# Patient Record
Sex: Female | Born: 1965 | Race: White | Hispanic: No | Marital: Single | Smoking: Never smoker
Health system: Southern US, Community
[De-identification: ages and names within clinical notes are randomized; demographics above are authoritative.]

## PROBLEM LIST (undated history)

## (undated) DIAGNOSIS — E039 Hypothyroidism, unspecified: Secondary | ICD-10-CM

## (undated) DIAGNOSIS — F419 Anxiety disorder, unspecified: Secondary | ICD-10-CM

## (undated) DIAGNOSIS — I10 Essential (primary) hypertension: Secondary | ICD-10-CM

## (undated) DIAGNOSIS — C569 Malignant neoplasm of unspecified ovary: Secondary | ICD-10-CM

## (undated) HISTORY — PX: ABDOMINAL HYSTERECTOMY: SHX81

---

## 1999-11-30 DIAGNOSIS — Z9889 Other specified postprocedural states: Secondary | ICD-10-CM

## 2010-03-21 ENCOUNTER — Inpatient Hospital Stay (HOSPITAL_COMMUNITY): Admission: EM | Admit: 2010-03-21 | Discharge: 2010-03-26 | Payer: Self-pay | Admitting: Emergency Medicine

## 2010-03-21 DIAGNOSIS — Z9089 Acquired absence of other organs: Secondary | ICD-10-CM

## 2010-03-22 ENCOUNTER — Ambulatory Visit: Payer: Self-pay | Admitting: Internal Medicine

## 2010-03-23 ENCOUNTER — Encounter (INDEPENDENT_AMBULATORY_CARE_PROVIDER_SITE_OTHER): Payer: Self-pay

## 2010-03-24 LAB — CONVERTED CEMR LAB
ALT: 246 units/L
Albumin: 3.3 g/dL
Alkaline Phosphatase: 144 units/L
Basophils Relative: 1 %
CO2: 28 meq/L
Chloride: 101 meq/L
HCT: 42.5 %
Lymphocytes Relative: 18 %
Lymphs Abs: 1.6 10*3/uL
MCV: 84.6 fL
Monocytes Relative: 6 %
Neutro Abs: 6.6 10*3/uL
Platelets: 299 10*3/uL
Potassium: 3.6 meq/L
Sodium: 137 meq/L
WBC: 9 10*3/uL

## 2010-04-09 ENCOUNTER — Ambulatory Visit: Payer: Self-pay | Admitting: Internal Medicine

## 2010-04-09 ENCOUNTER — Encounter (INDEPENDENT_AMBULATORY_CARE_PROVIDER_SITE_OTHER): Payer: Self-pay | Admitting: Nurse Practitioner

## 2010-04-09 DIAGNOSIS — I10 Essential (primary) hypertension: Secondary | ICD-10-CM

## 2010-04-09 DIAGNOSIS — E669 Obesity, unspecified: Secondary | ICD-10-CM | POA: Insufficient documentation

## 2010-04-09 DIAGNOSIS — F411 Generalized anxiety disorder: Secondary | ICD-10-CM | POA: Insufficient documentation

## 2010-04-14 ENCOUNTER — Encounter (INDEPENDENT_AMBULATORY_CARE_PROVIDER_SITE_OTHER): Payer: Self-pay | Admitting: Nurse Practitioner

## 2010-04-15 DIAGNOSIS — E039 Hypothyroidism, unspecified: Secondary | ICD-10-CM | POA: Insufficient documentation

## 2010-04-15 LAB — CONVERTED CEMR LAB
ALT: 42 units/L — ABNORMAL HIGH (ref 0–35)
Albumin: 4.1 g/dL (ref 3.5–5.2)
BUN: 17 mg/dL (ref 6–23)
CO2: 26 meq/L (ref 19–32)
Glucose, Bld: 96 mg/dL (ref 70–99)
Microalb, Ur: 4.18 mg/dL — ABNORMAL HIGH (ref 0.00–1.89)
Potassium: 4.8 meq/L (ref 3.5–5.3)
Sodium: 140 meq/L (ref 135–145)
TSH: 5.15 microintl units/mL — ABNORMAL HIGH (ref 0.350–4.500)

## 2010-04-23 ENCOUNTER — Ambulatory Visit: Payer: Self-pay | Admitting: Nurse Practitioner

## 2010-04-30 ENCOUNTER — Encounter (INDEPENDENT_AMBULATORY_CARE_PROVIDER_SITE_OTHER): Payer: Self-pay | Admitting: Nurse Practitioner

## 2010-05-04 ENCOUNTER — Telehealth (INDEPENDENT_AMBULATORY_CARE_PROVIDER_SITE_OTHER): Payer: Self-pay | Admitting: Nurse Practitioner

## 2010-05-14 ENCOUNTER — Encounter (INDEPENDENT_AMBULATORY_CARE_PROVIDER_SITE_OTHER): Payer: Self-pay | Admitting: Nurse Practitioner

## 2010-05-25 ENCOUNTER — Ambulatory Visit: Payer: Self-pay | Admitting: Nurse Practitioner

## 2010-05-25 LAB — CONVERTED CEMR LAB
Chlamydia, DNA Probe: NEGATIVE
Glucose, Urine, Semiquant: NEGATIVE
HCT: 42.1 % (ref 36.0–46.0)
Hemoglobin: 13 g/dL (ref 12.0–15.0)
KOH Prep: NEGATIVE
MCHC: 30.9 g/dL (ref 30.0–36.0)
MCV: 87.9 fL (ref 78.0–100.0)
Nitrite: NEGATIVE
OCCULT 1: NEGATIVE
Protein, U semiquant: NEGATIVE
Specific Gravity, Urine: 1.02
WBC Urine, dipstick: NEGATIVE
pH: 5.5

## 2010-05-26 ENCOUNTER — Encounter (INDEPENDENT_AMBULATORY_CARE_PROVIDER_SITE_OTHER): Payer: Self-pay | Admitting: Nurse Practitioner

## 2010-05-27 LAB — CONVERTED CEMR LAB: Pap Smear: NEGATIVE

## 2010-06-08 ENCOUNTER — Ambulatory Visit (HOSPITAL_COMMUNITY): Admission: RE | Admit: 2010-06-08 | Discharge: 2010-06-08 | Payer: Self-pay | Admitting: Internal Medicine

## 2010-08-13 ENCOUNTER — Encounter: Payer: Self-pay | Admitting: Gastroenterology

## 2010-08-17 ENCOUNTER — Encounter (INDEPENDENT_AMBULATORY_CARE_PROVIDER_SITE_OTHER): Payer: Self-pay | Admitting: *Deleted

## 2010-08-21 ENCOUNTER — Telehealth (INDEPENDENT_AMBULATORY_CARE_PROVIDER_SITE_OTHER): Payer: Self-pay | Admitting: Nurse Practitioner

## 2010-09-11 ENCOUNTER — Encounter (INDEPENDENT_AMBULATORY_CARE_PROVIDER_SITE_OTHER): Payer: Self-pay

## 2010-09-15 ENCOUNTER — Ambulatory Visit: Payer: Self-pay | Admitting: Gastroenterology

## 2010-12-29 NOTE — Letter (Signed)
Summary: [facility] Instructions  [facility]  [address] [place], [place] [address]   Phone: [phone]  Fax: [phone]       NANNA ERTLE    Mar 18, 1980    MRN: [id number]        Procedure Day Dorna Bloom: Wednesday 09-30-26     Arrival Time: 9:00 a.m.     Procedure Time: 10:00 a.m.     Location of Procedure:                     [facility] (4th Floor)   PREPARATION FOR COLONOSCOPY WITH MOVIPREP   Starting 5 days prior to your procedure   09-25-26 do not eat nuts, seeds, popcorn, corn, beans, peas,  salads, or any raw vegetables.  Do not take any fiber supplements (e.g. Metamucil, Citrucel, and Benefiber).  THE DAY BEFORE YOUR PROCEDURE         DATE:  09-29-10  DAY:  Tuesday  1.  Drink clear liquids the entire day-NO SOLID FOOD  2.  Do not drink anything colored red or purple.  Avoid juices with pulp.  No orange juice.  3.  Drink at least 64 oz. (8 glasses) of fluid/clear liquids during the day to prevent dehydration and help the prep work efficiently.  CLEAR LIQUIDS INCLUDE: Water Jello Ice Popsicles Tea (sugar ok, no milk/cream) Powdered fruit flavored drinks Coffee (sugar ok, no milk/cream) Gatorade Juice: apple, white grape, white cranberry  Lemonade Clear bullion, consomm, broth Carbonated beverages (any kind) Strained chicken noodle soup Hard Candy                             4.  In the morning, mix first dose of MoviPrep solution:    Empty 1 Pouch A and 1 Pouch B into the disposable container    Add lukewarm drinking water to the top line of the container. Mix to dissolve    Refrigerate (mixed solution should be used within 24 hrs)  5.  Begin drinking the prep at 5:00 p.m. The MoviPrep container is divided by 4 marks.   Every 15 minutes drink the solution down to the next mark (approximately 8 oz) until the full liter is complete.   6.  Follow completed prep with 16 oz of clear liquid of your choice (Nothing red or purple).   Continue to drink clear liquids until bedtime.  7.  Before going to bed, mix second dose of MoviPrep solution:    Empty 1 Pouch A and 1 Pouch B into the disposable container    Add lukewarm drinking water to the top line of the container. Mix to dissolve    Refrigerate  THE DAY OF YOUR PROCEDURE      DATE:  09-30-26 DAY:  Wednesday  Beginning at  5:00 a.m. (5 hours before procedure):         1. Every 15 minutes, drink the solution down to the next mark (approx 8 oz) until the full liter is complete.  2. Follow completed prep with 16 oz. of clear liquid of your choice.    3. You may drink clear liquids until  8:00 a.m.  (2 HOURS BEFORE PROCEDURE).   MEDICATION INSTRUCTIONS  Unless otherwise instructed, you should take regular prescription medications with a small sip of water   as early as possible the morning of your procedure.       Additional medication instructions: Do not tkae  HCTZ am of procedure.         OTHER INSTRUCTIONS  You will need a responsible adult at least 43 years of age to accompany you and drive you home.   This person must remain in the waiting room during your procedure.  Wear loose fitting clothing that is easily removed.  Leave jewelry and other valuables at home.  However, you may wish to bring a book to read or  an iPod/MP3 player to listen to music as you wait for your procedure to start.  Remove all body piercing jewelry and leave at home.  Total time from sign-in until discharge is approximately 2-3 hours.  You should go home directly after your procedure and rest.  You can resume normal activities the  day after your procedure.  The day of your procedure you should not:   Drive   Make legal decisions   Operate machinery   Drink alcohol   Return to work  You will receive specific instructions about eating, activities and medications before you leave.    The above instructions have been reviewed and explained to me by    Ulis Rias RN  September 15, 2010 11:48 AM     I fully understand and can verbalize these instructions _____________________________ Date _________

## 2010-12-29 NOTE — Assessment & Plan Note (Signed)
Summary: NEw - Establish Care   Vital Signs:  Patient profile:   45 year old female LMP:     03/2010 Height:      61.25 inches Weight:      255 pounds BMI:     47.96 BSA:     2.10 Temp:     98.1 degrees F oral Pulse rate:   89 / minute Pulse rhythm:   regular Resp:     20 per minute BP sitting:   160 / 110  (left arm) Cuff size:   large  Vitals Entered By: Levon Hedger (Apr 09, 2010 9:32 AM)  Nutrition Counseling: Patient's BMI is greater than 25 and therefore counseled on weight management options. CC: hospital followup [facility] ...  need hormones level checked, Hypertension Management Is Patient Diabetic? No Pain Assessment Patient in pain? no       Does patient need assistance? Functional Status Self care Ambulation Normal LMP (date): 03/2010     Enter LMP: 03/2010   CC:  hospital followup MC ...  need hormones level checked and Hypertension Management.  History of Present Illness:  Pt into the office to establish care. PCP - none Recently seen in the ER and was hospitalized from 03/21/2010 to 03/26/2010 (hospital d/c reviewed)  Acure cholecystitis status post laparoscopic cholecystectomy - 03/22/2010 with ERCP spincterotomy and stone extraction  Malignant hypertension - no medications prior to hospital visit She was started on clonidine, hctz 25mg  and metoprolol all of which she presents here with today  Obesity - pt recognizes her need to lose weight.  Weight has fluctuated over the years.  She has started to exercise again.  Hx of generalized anxiety disorder  Social - self employed  Hypertension History:      She denies headache, chest pain, and palpitations.  She notes no problems with any antihypertensive medication side effects.  pt has already checked her blood pressure today.  .  Further comments include: She has her medications in the office with her today.        Positive major cardiovascular risk factors include hypertension.  Negative major  cardiovascular risk factors include female age less than 56 years old and non-tobacco-user status.    Habits & Providers  Alcohol-Tobacco-Diet     Alcohol drinks/day: <1     Tobacco Status: never  Exercise-Depression-Behavior     Does Patient Exercise: no     Drug Use: never  Comments: Pt is trying to become more active  Allergies (verified): 1)  ! * Shellfish  Past History:  Past Surgical History: Cholecystectomy 03/21/2010 laminectomy L4-5 2001  Family History: father - htn, renal failure (deceased in 31's) mother - deceased from colon cancer at age 38, htn brother - deceased at age 61 from MI  Social History: no children tobacco - none ETOH - social Drug - noneSmoking Status:  never Drug Use:  never Does Patient Exercise:  no  Review of Systems General:  Denies fatigue; c/o dry mouth . CV:  Denies chest pain or discomfort. Resp:  Denies cough. GI:  Denies abdominal pain, nausea, and vomiting.  Physical Exam  General:  alert.  obese Head:  normocephalic.   Lungs:  normal breath sounds.   Heart:  normal rate and regular rhythm.   Abdomen:  soft, non-tender, and normal bowel sounds.   Msk:  up to the exam table Neurologic:  alert & oriented X3.   Psych:  Oriented X3.     Impression & Recommendations:  Problem #  1:  HYPERTENSION, BENIGN ESSENTIAL, SEVERE (ICD-401.1) Blood pressure is very elevated Will increase metoprolol to 50mg  continue other meds dif dx includes secondary hyperaldosteronism, pheochromocytoma Her updated medication list for this problem includes:    Clonidine Hcl 0.2 Mg Tabs (Clonidine hcl) ..... One tablet by mouth two times a day for blood pressure    Metoprolol Tartrate 50 Mg Tabs (Metoprolol tartrate) ..... One tablet by mouth two times a day for blood pressure    Hydrochlorothiazide 25 Mg Tabs (Hydrochlorothiazide) ..... One tablet by mouth daily for blood pressure  Orders: T-Comprehensive Metabolic Panel ([id number]) T-FSH  ([id number]) T-Urine Microalbumin w/creat. ratio [phone]) T-Aldosterone/Renin ratio [phone]) T-Urine 24hr. Aldosterone ([id number])  Problem # 2:  OBESITY (ICD-278.00) pt is aware of the need to lose weight she is committed to attempting that effort Orders: T-TSH ([id number]) T-FSH ([id number])  Problem # 3:  CHOLECYSTECTOMY, HX OF (ICD-V45.79) recovered labs  Complete Medication List: 1)  Clonidine Hcl 0.2 Mg Tabs (Clonidine hcl) .... One tablet by mouth two times a day for blood pressure 2)  Metoprolol Tartrate 50 Mg Tabs (Metoprolol tartrate) .... One tablet by mouth two times a day for blood pressure 3)  Hydrochlorothiazide 25 Mg Tabs (Hydrochlorothiazide) .... One tablet by mouth daily for blood pressure  Hypertension Assessment/Plan:      The patient's hypertensive risk group is category A: No risk factors and no target organ damage.  Today's blood pressure is 160/110.  Her blood pressure goal is < 140/90.   Patient Instructions: 1)  Schedule an appointment for eligibility at [facility] for elibility 2)  You will need an orange card. 3)  **Try to see if she can get an worked in for an eligibility appointment** 4)  Nurse visit in 2 weeks for blood pressure - take medications at least 2 hours before this visit 5)  If blood pressure is ok will schedule for a complete physical after getting orange card 6)  If blood pressure is still elevated will need further titration on meds Prescriptions: HYDROCHLOROTHIAZIDE 25 MG TABS (HYDROCHLOROTHIAZIDE) One tablet by mouth daily for blood pressure  #30 x 1   Entered and Authorized by:   Lehman Prom FNP   Signed by:   Lehman Prom FNP on 04/09/2010   Method used:   Print then Give to Patient   RxID:   [id number] METOPROLOL TARTRATE 50 MG TABS (METOPROLOL TARTRATE) One tablet by mouth two times a day for blood pressure  #60 x 1   Entered and Authorized by:   Lehman Prom FNP   Signed by:    Lehman Prom FNP on 04/09/2010   Method used:   Print then Give to Patient   RxID:   [id number] CLONIDINE HCL 0.2 MG TABS (CLONIDINE HCL) One tablet by mouth two times a day for blood pressure  #60 x 1   Entered and Authorized by:   Lehman Prom FNP   Signed by:   Lehman Prom FNP on 04/09/2010   Method used:   Print then Give to Patient   RxID:   [id number]   Appended Document: NEw - Establish Care     Allergies: 1)  ! * Shellfish   Complete Medication List: 1)  Clonidine Hcl 0.2 Mg Tabs (Clonidine hcl) .... One tablet by mouth two times a day for blood pressure 2)  Metoprolol Tartrate 50 Mg Tabs (Metoprolol tartrate) .... One tablet by mouth two times a day for blood pressure 3)  Hydrochlorothiazide 25 Mg Tabs (Hydrochlorothiazide) .... One  tablet by mouth daily for blood pressure   Laboratory Results   Urine Tests  Date/Time Received: Apr 09, 2010 4:21 PM   Routine Urinalysis   Color: yellow Glucose: negative   (Normal Range: Negative) Bilirubin: small   (Normal Range: Negative) Ketone: trace (5)   (Normal Range: Negative) Spec. Gravity: 1.025   (Normal Range: 1.003-1.035) Blood: negative   (Normal Range: Negative) pH: 5.5   (Normal Range: 5.0-8.0) Protein: 30   (Normal Range: Negative) Urobilinogen: 0.2   (Normal Range: 0-1) Nitrite: negative   (Normal Range: Negative) Leukocyte Esterace: negative   (Normal Range: Negative)

## 2010-12-29 NOTE — Letter (Signed)
Summary: *HSN Results Follow up  [facility]  [address] [place], [place] [address]   Phone: [phone]  Fax: [phone]      05/26/2010   [name] [address] [place], [place]  [address]   Dear  Dawn Juarez,                            ____S.Drinkard,FNP   ____D. Gore,FNP       ____B. McPherson,MD   ____V. Rankins,MD    ____E. Mulberry,MD    _X___N. Daphine Deutscher, FNP  ____D. Reche Dixon, MD    ____K. Philipp Deputy, MD    ____Other     This letter is to inform you that your recent test(s):  ___X____Pap Smear    ___X____Lab Test     _______X-ray    ___X____ is within acceptable limits  _______ requires a medication change  _______ requires a follow-up lab visit  _______ requires a follow-up visit with your provider   Comments: Labs done during recent office visit are normal.  Pap Smear results ______________________________.       _________________________________________________________ If you have any questions, please contact our office [phone].                    Sincerely,    Lehman Prom FNP [facility]

## 2010-12-29 NOTE — Letter (Signed)
Summary: *HSN Results Follow up  [facility]  [address] [place], [place] [address]   Phone: [phone]  Fax: [phone]      04/30/2010   [facility] [address] [place], [place]  [address]   Dear  Ms. Donnajean Lopes,                            ____S.Drinkard,FNP   ____D. Gore,FNP       ____B. McPherson,MD   ____V. Rankins,MD    ____E. Mulberry,MD    _X___N. Daphine Deutscher, FNP  ____D. Reche Dixon, MD    ____K. Philipp Deputy, MD    ____Other     This letter is to inform you that your recent test(s):  _______Pap Smear    ___X____Lab Test     _______X-ray    ___X____ Urine  is within acceptable limits  ___X____ requires a follow-up lab visit to recheck your thyroid labs (you should have already scheduled this appointment)       _________________________________________________________ If you have any questions, please contact our office [phone].                    Sincerely,    Lehman Prom FNP [facility]

## 2010-12-29 NOTE — Letter (Signed)
Summary: PT INFORMATION SHEET  PT INFORMATION SHEET   Imported By: Arta Bruce 06/03/2010 14:34:00  _____________________________________________________________________  External Attachment:    Type:   Image     Comment:   External Document

## 2010-12-29 NOTE — Letter (Signed)
Summary: Pre Visit Letter Revised  [facility]  [address] [place], [place] [address]   Phone: [phone]  Fax: [phone]        08/17/2010 MRN: [id number] Dawn Juarez [address] [place], [place]  [address]             Procedure Date:  09/30/2010   Welcome to the Gastroenterology Division at [facility].    You are scheduled to see a nurse for your pre-procedure visit on 09/15/2010 at 11:00AM on the 3rd floor at [facility], [address]  We ask that you try to arrive at our office 15 minutes prior to your appointment time to allow for check-in.  Please take a minute to review the attached form.  If you answer "Yes" to one or more of the questions on the first page, we ask that you call the person listed at your earliest opportunity.  If you answer "No" to all of the questions, please complete the rest of the form and bring it to your appointment.    Your nurse visit will consist of discussing your medical and surgical history, your immediate family medical history, and your medications.   If you are unable to list all of your medications on the form, please bring the medication bottles to your appointment and we will list them.  We will need to be aware of both prescribed and over the counter drugs.  We will need to know exact dosage information as well.    Please be prepared to read and sign documents such as consent forms, a financial agreement, and acknowledgement forms.  If necessary, and with your consent, a friend or relative is welcome to sit-in on the nurse visit with you.  Please bring your insurance card so that we may make a copy of it.  If your insurance requires a referral to see a specialist, please bring your referral form from your primary care physician.  No co-pay is required for this nurse visit.     If you cannot keep your appointment, please call [phone] to cancel or reschedule prior to your appointment date.  This  allows [place] the opportunity to schedule an appointment for another patient in need of care.    Thank you for choosing [facility] for your medical needs.  We appreciate the opportunity to care for you.  Please visit [place] at our website  to learn more about our practice.  Sincerely, The Gastroenterology Division

## 2010-12-29 NOTE — Letter (Signed)
Summary: [facility]  [facility]   Imported By: Lennie Odor 09/02/2010 16:41:17  _____________________________________________________________________  External Attachment:    Type:   Image     Comment:   External Document

## 2010-12-29 NOTE — Progress Notes (Signed)
Summary: No longer a pt. here  Phone Note Outgoing Call   Summary of Call: Do you want me to refill her metoprolol x4?  She last refilled it 05/11/10 and is supposed to be taking it twice a day.   Left message on answering machine for pt. to return call.  Initial call taken by: Dutch Quint RN,  August 21, 2010 11:46 AM  Follow-up for Phone Call        yes, ok to refill  please be sure pt is taking twice per day. She should also be taking amlodipine 10mg  by mouth daily Clonidine was stopped due to side effects  Follow-up by: Lehman Prom FNP,  August 21, 2010 1:13 PM  Additional Follow-up for Phone Call Additional follow up Details #1::        Pt. states that Rx was sent to [place] in error -- has new provider. Additional Follow-up by: Dutch Quint RN,  August 21, 2010 2:52 PM

## 2010-12-29 NOTE — Progress Notes (Signed)
Summary: constipation  Phone Note Call from Patient Call back at [name]   Summary of Call: THE PT HAS EXTREME CONSTIPATION AND SHE HAD BEEN TAKING FIBER PILLS AND FIEBER DIET BUT IS NOT HELPING HER.   THE PT IS TAKING HER REGULAR MEDICATION (METROPOLOL) AND SHE FEELS EXTREMELLY TIRED.  Dawn Juarez Initial call taken by: Manon Hilding,  May 04, 2010 8:49 AM  Follow-up for Phone Call        Pt. has been drinking about 10 glasses of water daily, eating foods high in fiber, exercising daily. Since she started the Metropolol she feels really tired and constipated. Encouraged to try OTC Miralax, she had with relief. However continues to get constipated. Discussed bowel training, she has done this without relief.  Would like to change BP med to something that will not cause constipation. Will forward on to N. Daphine Deutscher Dawn Juarez. Follow-up by: Gaylyn Cheers RN,  May 04, 2010 12:06 PM  Additional Follow-up for Phone Call Additional follow up Details #1::        clonidine is most likely causing the constipation Also she was slightly hypothyroid during her recent labs (not enought to needs meds) When is pt's f/u appt? BP was great during f/u visit.   would encourage her to start miralax daily (can get from [facility] pharmacy if she would like) Additional Follow-up by: Dawn Juarez,  May 04, 2010 1:11 PM    Additional Follow-up for Phone Call Additional follow up Details #2::    Pt. notified of above.  Will continue to use Miralax and will discuss with you on follow-up 05/25/10, she does not want to take a laxative regularly.  Request to order Miralx through [facility].  Follow-up by: Gaylyn Cheers RN,  May 04, 2010 1:29 PM  Additional Follow-up for Phone Call Additional follow up Details #3:: Details for Additional Follow-up Action Taken: will order miralax daily (rx sent to [facility] pharmacy) She may not need to take daily long term. would advise she do so for at least 5 days then  monitor stools. She also need high fiber foods - raisins, oatmeal, apples (with peeling), grains,  Activity such as walking also helps Dawn Juarezmartin, Dawn Juarez  May 04, 2010  1:38 PM  Pt. contacted and is aware of above.  Additional Follow-up by: Gaylyn Cheers RN,  May 04, 2010 1:47 PM  New/Updated Medications: MIRALAX  POWD (POLYETHYLENE GLYCOL 3350) 17gm with 8 oz of water or juice daily Prescriptions: MIRALAX  POWD (POLYETHYLENE GLYCOL 3350) 17gm with 8 oz of water or juice daily  #1 month qs x 3   Entered and Authorized by:   Dawn Juarez   Signed by:   Dawn Juarez on 05/04/2010   Method used:   Faxed to ...       [facility] (retail)       [address] [place], [place]  [address]       Ph: [phone]       Fax: [phone]   RxID:   [id number]

## 2010-12-29 NOTE — Miscellaneous (Signed)
Summary: Lec previsit  Clinical Lists Changes  Medications: Added new medication of MOVIPREP 100 GM  SOLR (PEG-KCL-NACL-NASULF-NA ASC-C) As per prep instructions. - Signed Rx of MOVIPREP 100 GM  SOLR (PEG-KCL-NACL-NASULF-NA ASC-C) As per prep instructions.;  #1 x 0;  Signed;  Entered by: Ulis Rias RN;  Authorized by: Mardella Layman MD [facility];  Method used: Electronically to [facility]. [phone]*, [address], [place], [place], [place]  [address], Ph: [phone], Fax: [phone] Observations: Added new observation of ALLERGY REV: Done (09/15/2010 10:32)    Prescriptions: MOVIPREP 100 GM  SOLR (PEG-KCL-NACL-NASULF-NA ASC-C) As per prep instructions.  #1 x 0   Entered by:   Ulis Rias RN   Authorized by:   Mardella Layman MD [facility]   Signed by:   Ulis Rias RN on 09/15/2010   Method used:   Electronically to        [facility]. [phone]* (retail)       [address]       [place], [place]  [address]       Ph: [phone]       Fax: [phone]   RxID:   [id number]

## 2010-12-29 NOTE — Assessment & Plan Note (Signed)
Summary: Complete Physical Exam   Vital Signs:  Patient profile:   45 year old female Menstrual status:  regular LMP:     05/20/2010 Height:      61 inches Weight:      247.3 pounds Temp:     98.1 degrees F Pulse rate:   81 / minute Pulse rhythm:   regular Resp:     20 per minute BP sitting:   136 / 85  (right arm) Cuff size:   large  Vitals Entered By: Gaylyn Cheers RN (May 25, 2010 10:52 AM) CC: CPP, concerned about her meds extreme dry mouth, feeling tired, unable to manage the level of exercise to lose wt., Hypertension Management Is Patient Diabetic? No Pain Assessment Patient in pain? no       Does patient need assistance? Functional Status Self care Ambulation Normal LMP (date): 05/20/2010     Menstrual Status regular Enter LMP: 05/20/2010   CC:  CPP, concerned about her meds extreme dry mouth, feeling tired, unable to manage the level of exercise to lose wt., and Hypertension Management.  History of Present Illness:  Pt into the office for a complete physical exam  PAP - last done 10 years ago but all prior at that time Menses monthly and last ended 1 day no children  Mammogram - never had a mammogram no family history of breast cancer  Optho - reading glasses. No recent eye exam  Dental - last dental exam was 1 year ago.  She did get the bottom teeth cleaned  Colonscopy - Mother deceased of colon cancer last colonscopy was 15 years ago - normal   Obesity - pt has been trying to lose weight and has lost 8 pounds since her last visit.   Hypertension History:      She denies headache, chest pain, and palpitations.  She notes the following problems with antihypertensive medication side effects: Pt reports that she wakes in the morning and she feels fine.  But then she takes her medications and she then gets fatigue during the day withing 30 minutes of taking meds, thirst is unbelievable.  constipation is still a problem with high fiber.    Positive major cardiovascular risk factors include hypertension.  Negative major cardiovascular risk factors include female age less than 48 years old and non-tobacco-user status.     Habits & Providers  Alcohol-Tobacco-Diet     Alcohol drinks/day: 0     Tobacco Status: never  Exercise-Depression-Behavior     Does Patient Exercise: yes     Type of exercise: exercise tape     Exercise (avg: min/session): 30-60     Times/week: >7     Have you felt down or hopeless? yes     Have you felt little pleasure in things? yes     STD Risk: never     Drug Use: past     Seat Belt Use: always     Sun Exposure: infrequent  Comments: PHQ-9 score = 11  Current Medications (verified): 1)  Clonidine Hcl 0.2 Mg Tabs (Clonidine Hcl) .... One Tablet By Mouth Two Times A Day For Blood Pressure 2)  Metoprolol Tartrate 50 Mg Tabs (Metoprolol Tartrate) .... One Tablet By Mouth Two Times A Day For Blood Pressure 3)  Hydrochlorothiazide 25 Mg Tabs (Hydrochlorothiazide) .... One Tablet By Mouth Daily For Blood Pressure 4)  Miralax  Powd (Polyethylene Glycol 3350) .Marland Kitchen.. 17gm With 8 Oz of Water or Juice Daily  Allergies (verified): 1)  ! *  Shellfish  Social History: Does Patient Exercise:  yes STD Risk:  never Drug Use:  past Seat Belt Use:  always Sun Exposure-Excessive:  infrequent  Review of Systems General:  Complains of fatigue; denies fever; when taking medications. Eyes:  Denies discharge. ENT:  Denies earache. CV:  Denies chest pain or discomfort. Resp:  Denies cough. GI:  Complains of constipation; denies abdominal pain, nausea, and vomiting. GU:  Denies discharge. MS:  Denies joint pain. Derm:  Denies dryness. Psych:  Denies anxiety and depression.  Physical Exam  General:  alert.  obese Head:  normocephalic.   Eyes:  pupils equal, pupils round, and pupils reactive to light.   Ears:  right ear with moderate cerumen left ear with scarring Nose:  no nasal discharge.   Mouth:   pharynx pink and moist.   Neck:  supple.   Chest Wall:  no mass.   Breasts:  no masses.   Lungs:  normal breath sounds.   Heart:  normal rate and regular rhythm.   Abdomen:  soft and non-tender.   BS decreased x 4 Rectal:  no external abnormalities.   Msk:  up to the exam  Neurologic:  alert & oriented X3.    Pelvic Exam  Vulva:      normal appearance.   Urethra and Bladder:      Urethra--normal.   Vagina:      malodorus.   Cervix:      unable to visualize cervical O's Uterus:      non-tender.   Adnexa:      nontender bilaterally.   Rectum:      normal, heme negative stool.      Impression & Recommendations:  Problem # 1:  ROUTINE GYNECOLOGICAL EXAMINATION (ICD-V72.31) labs done PAP don  advised optho and dental exams guaiac negative EKG gone  PHQ-9 score = 1 tdap given today in office Orders: UA Dipstick w/o Micro (automated)  (81003) Hemoccult Guaiac-1 spec.(in office) (82270) T-CBC w/Diff (47829-56213) T- GC Chlamydia (08657) KOH/ WET Mount [phone]) Pap Smear, Thin Prep ( Collection of) (E9528) T-HIV Antibody  (Reflex) ([id number])  Problem # 2:  UNSPECIFIED BREAST SCREENING (ICD-V76.10) self breast exam placcard given to pt  mammogram scheduled Orders: Mammogram (Screening) (Mammo)  Problem # 3:  OBESITY (ICD-278.00) down 8 pounds since last visit advised pt to continue weight loss efforts  Problem # 4:  HYPERTENSION, BENIGN ESSENTIAL, SEVERE (ICD-401.1) will d/c clonidine due to side effects will add amlodipine The following medications were removed from the medication list:    Clonidine Hcl 0.2 Mg Tabs (Clonidine hcl) ..... One tablet by mouth two times a day for blood pressure Her updated medication list for this problem includes:    Metoprolol Tartrate 50 Mg Tabs (Metoprolol tartrate) ..... One tablet by mouth two times a day for blood pressure    Hydrochlorothiazide 25 Mg Tabs (Hydrochlorothiazide) ..... One tablet by mouth daily for blood  pressure    Amlodipine Besylate 10 Mg Tabs (Amlodipine besylate) ..... One tablet by mouth daily for blood pressure  Orders: UA Dipstick w/o Micro (automated)  (81003) EKG w/ Interpretation (93000)  Complete Medication List: 1)  Metoprolol Tartrate 50 Mg Tabs (Metoprolol tartrate) .... One tablet by mouth two times a day for blood pressure 2)  Hydrochlorothiazide 25 Mg Tabs (Hydrochlorothiazide) .... One tablet by mouth daily for blood pressure 3)  Miralax Powd (Polyethylene glycol 3350) .Marland Kitchen.. 17gm with 8 oz of water or juice daily 4)  Amlodipine Besylate 10 Mg  Tabs (Amlodipine besylate) .... One tablet by mouth daily for blood pressure  Other Orders: Tdap => 34yrs IM ([address]) Admin 1st Vaccine (52841) Admin 1st Vaccine [facility]) [phone])  Hypertension Assessment/Plan:      The patient's hypertensive risk group is category A: No risk factors and no target organ damage.  Today's blood pressure is 136/85.  Her blood pressure goal is < 140/90.  Patient Instructions: 1)  You have received your tetanus today. It will be good for 10 years 2)  Labs done today - you will be notified of the results 3)  Keep your appointment for mammogram 4)  Blood pressure - discontinue clonidine 5)  start amlodipine 10mg  by mouth daily 6)  check blood pressure DAILY at home at least 2 hours after medications 7)  Follow up in 4 weeks with n.martin,fnp for blood pressure 8)  Do not eat before this visit - lipids 9)  But do take your blood pressure medications Prescriptions: METOPROLOL TARTRATE 50 MG TABS (METOPROLOL TARTRATE) One tablet by mouth two times a day for blood pressure  #60 x 1   Entered and Authorized by:   Lehman Prom FNP   Signed by:   Lehman Prom FNP on 05/25/2010   Method used:   Print then Give to Patient   RxID:   [id number] HYDROCHLOROTHIAZIDE 25 MG TABS (HYDROCHLOROTHIAZIDE) One tablet by mouth daily for blood pressure  #30 x 5   Entered and Authorized by:   Lehman Prom  FNP   Signed by:   Lehman Prom FNP on 05/25/2010   Method used:   Print then Give to Patient   RxID:   [id number] AMLODIPINE BESYLATE 10 MG TABS (AMLODIPINE BESYLATE) One tablet by mouth daily for blood pressure  #30 x 1   Entered and Authorized by:   Lehman Prom FNP   Signed by:   Lehman Prom FNP on 05/25/2010   Method used:   Print then Give to Patient   RxID:   [id number]   Laboratory Results   Urine Tests  Date/Time Received: May 25, 2010 11:15 AM  Date/Time Reported: May 25, 2010 11:15 AM   Routine Urinalysis   Color: lt. yellow Appearance: Clear Glucose: negative   (Normal Range: Negative) Bilirubin: negative   (Normal Range: Negative) Ketone: negative   (Normal Range: Negative) Spec. Gravity: 1.020   (Normal Range: 1.003-1.035) Blood: negative   (Normal Range: Negative) pH: 5.5   (Normal Range: 5.0-8.0) Protein: negative   (Normal Range: Negative) Urobilinogen: 0.2   (Normal Range: 0-1) Nitrite: negative   (Normal Range: Negative) Leukocyte Esterace: negative   (Normal Range: Negative)    Date/Time Received: May 25, 2010 12:08 PM   Wet Mount/KOH Source: vaginal WBC/hpf: 1-5 Bacteria/hpf: rare Clue cells/hpf: none Yeast/hpf: none Trichomonas/hpf: none  Stool - Occult Blood Hemmoccult #1: negative Date: 05/25/2010    Prevention & Chronic Care Immunizations   Influenza vaccine: Not documented    Tetanus booster: 05/25/2010: Tdap    Pneumococcal vaccine: Not documented  Other Screening   Pap smear: Not documented   Pap smear action/deferral: Ordered  (05/25/2010)   Pap smear due: 05/26/2011    Mammogram: Not documented   Mammogram action/deferral: Ordered  (05/25/2010)   Smoking status: never  (05/25/2010)  Lipids   Total Cholesterol: Not documented   LDL: Not documented   LDL Direct: Not documented   HDL: Not documented   Triglycerides: Not documented  Hypertension   Last Blood Pressure: 136 / 85   (05/25/2010)  Serum creatinine: 0.85  (04/09/2010)   Serum potassium 4.8  (04/09/2010)  Self-Management Support :    Hypertension self-management support: Not documented   Nursing Instructions: Give tetanus booster today    Laboratory Results   Urine Tests    Routine Urinalysis   Color: lt. yellow Appearance: Clear Glucose: negative   (Normal Range: Negative) Bilirubin: negative   (Normal Range: Negative) Ketone: negative   (Normal Range: Negative) Spec. Gravity: 1.020   (Normal Range: 1.003-1.035) Blood: negative   (Normal Range: Negative) pH: 5.5   (Normal Range: 5.0-8.0) Protein: negative   (Normal Range: Negative) Urobilinogen: 0.2   (Normal Range: 0-1) Nitrite: negative   (Normal Range: Negative) Leukocyte Esterace: negative   (Normal Range: Negative)      Wet Mount Wet Mount KOH: Negative  Stool - Occult Blood Hemmoccult #1: negative    Tetanus/Td Vaccine    Vaccine Type: Tdap    Site: right deltoid    Mfr: Sanofi Pasteur    Dose: 0.5 ml    Route: IM    Given by: Vesta Mixer CMA    Exp. Date: 09/30/2012    Lot #: [id number]    VIS given: 10/17/07 version given May 25, 2010.

## 2010-12-29 NOTE — Letter (Signed)
Summary: PT RECEIVED REOCRDS FOR  SELF  PT RECEIVED REOCRDS FOR  SELF   Imported By: Arta Bruce 05/14/2010 12:56:16  _____________________________________________________________________  External Attachment:    Type:   Image     Comment:   External Document

## 2010-12-29 NOTE — Progress Notes (Signed)
Summary: Office Visit//DEPRESSION SCREENING  Office Visit//DEPRESSION SCREENING   Imported By: Arta Bruce 06/15/2010 11:11:20  _____________________________________________________________________  External Attachment:    Type:   Image     Comment:   External Document

## 2011-01-13 ENCOUNTER — Encounter (INDEPENDENT_AMBULATORY_CARE_PROVIDER_SITE_OTHER): Payer: Self-pay | Admitting: *Deleted

## 2011-01-19 ENCOUNTER — Encounter (INDEPENDENT_AMBULATORY_CARE_PROVIDER_SITE_OTHER): Payer: Self-pay | Admitting: *Deleted

## 2011-01-20 ENCOUNTER — Encounter: Payer: Self-pay | Admitting: Gastroenterology

## 2011-01-20 NOTE — Letter (Signed)
Summary: Pre Visit Letter Revised  [facility]  [address] [place], [place] [address]   Phone: [phone]  Fax: [phone]        01/13/2011 MRN: [id number] [facility] [address] [place], [place]  [address]             Procedure Date:  02-03-11   Welcome to the Gastroenterology Division at [facility].    You are scheduled to see a nurse for your pre-procedure visit on 01-20-11 at 8:30A.M. on the 3rd floor at [facility], [address]  We ask that you try to arrive at our office 15 minutes prior to your appointment time to allow for check-in.  Please take a minute to review the attached form.  If you answer "Yes" to one or more of the questions on the first page, we ask that you call the person listed at your earliest opportunity.  If you answer "No" to all of the questions, please complete the rest of the form and bring it to your appointment.    Your nurse visit will consist of discussing your medical and surgical history, your immediate family medical history, and your medications.   If you are unable to list all of your medications on the form, please bring the medication bottles to your appointment and we will list them.  We will need to be aware of both prescribed and over the counter drugs.  We will need to know exact dosage information as well.    Please be prepared to read and sign documents such as consent forms, a financial agreement, and acknowledgement forms.  If necessary, and with your consent, a friend or relative is welcome to sit-in on the nurse visit with you.  Please bring your insurance card so that we may make a copy of it.  If your insurance requires a referral to see a specialist, please bring your referral form from your primary care physician.  No co-pay is required for this nurse visit.     If you cannot keep your appointment, please call [phone] to cancel or reschedule prior to your appointment date.  This allows [place]  the opportunity to schedule an appointment for another patient in need of care.    Thank you for choosing [facility] for your medical needs.  We appreciate the opportunity to care for you.  Please visit [place] at our website  to learn more about our practice.  Sincerely, The Gastroenterology Division

## 2011-01-26 NOTE — Letter (Signed)
Summary: [facility] Instructions  [facility]  [address] [place], [place] [address]   Phone: [phone]  Fax: [phone]       Dawn Juarez    December 16, 1965    MRN: [id number]        Procedure Day Dorna Bloom:  [facility] 02/03/11     Arrival Time: 10:30am     Procedure Time: 11:30am    Location of Procedure:                    _X _  [facility] (4th Floor)                       PREPARATION FOR COLONOSCOPY WITH MOVIPREP   Starting 5 days prior to your procedure FRIDAY 03/02 do not eat nuts, seeds, popcorn, corn, beans, peas,  salads, or any raw vegetables.  Do not take any fiber supplements (e.g. Metamucil, Citrucel, and Benefiber).  THE DAY BEFORE YOUR PROCEDURE         DATE: TUESDAY  03/06  1.  Drink clear liquids the entire day-NO SOLID FOOD  2.  Do not drink anything colored red or purple.  Avoid juices with pulp.  No orange juice.  3.  Drink at least 64 oz. (8 glasses) of fluid/clear liquids during the day to prevent dehydration and help the prep work efficiently.  CLEAR LIQUIDS INCLUDE: Water Jello Ice Popsicles Tea (sugar ok, no milk/cream) Powdered fruit flavored drinks Coffee (sugar ok, no milk/cream) Gatorade Juice: apple, white grape, white cranberry  Lemonade Clear bullion, consomm, broth Carbonated beverages (any kind) Strained chicken noodle soup Hard Candy                             4.  In the morning, mix first dose of MoviPrep solution:    Empty 1 Pouch A and 1 Pouch B into the disposable container    Add lukewarm drinking water to the top line of the container. Mix to dissolve    Refrigerate (mixed solution should be used within 24 hrs)  5.  Begin drinking the prep at 5:00 p.m. The MoviPrep container is divided by 4 marks.   Every 15 minutes drink the solution down to the next mark (approximately 8 oz) until the full liter is complete.   6.  Follow completed prep with 16 oz of clear liquid of your choice (Nothing red or  purple).  Continue to drink clear liquids until bedtime.  7.  Before going to bed, mix second dose of MoviPrep solution:    Empty 1 Pouch A and 1 Pouch B into the disposable container    Add lukewarm drinking water to the top line of the container. Mix to dissolve    Refrigerate  THE DAY OF YOUR PROCEDURE      DATE: [facility]  03/07  Beginning at  6:30 a.m. (5 hours before procedure):         1. Every 15 minutes, drink the solution down to the next mark (approx 8 oz) until the full liter is complete.  2. Follow completed prep with 16 oz. of clear liquid of your choice.    3. You may drink clear liquids until 9:30am (2 HOURS BEFORE PROCEDURE).   MEDICATION INSTRUCTIONS  Unless otherwise instructed, you should take regular prescription medications with a small sip of water   as early as possible the morning of your procedure.  Additional medication instructions: HOLD YOUR HCTZ the day of your procedure only         OTHER INSTRUCTIONS  You will need a responsible adult at least 43 years of age to accompany you and drive you home.   This person must remain in the waiting room during your procedure.  Wear loose fitting clothing that is easily removed.  Leave jewelry and other valuables at home.  However, you may wish to bring a book to read or  an iPod/MP3 player to listen to music as you wait for your procedure to start.  Remove all body piercing jewelry and leave at home.  Total time from sign-in until discharge is approximately 2-3 hours.  You should go home directly after your procedure and rest.  You can resume normal activities the  day after your procedure.  The day of your procedure you should not:   Drive   Make legal decisions   Operate machinery   Drink alcohol   Return to work  You will receive specific instructions about eating, activities and medications before you leave.    The above instructions have been reviewed and explained to me by   Karl Bales RN  January 20, 2011 8:43 AM    I fully understand and can verbalize these instructions _____________________________ Date _________

## 2011-01-26 NOTE — Miscellaneous (Signed)
Summary: [facility] previsit  Clinical Lists Changes  Observations: Added new observation of ALLERGY REV: Done (01/20/2011 8:15)  Pt has a Moviprep from her last scheduled procedure.

## 2011-02-03 ENCOUNTER — Other Ambulatory Visit: Payer: Self-pay | Admitting: Gastroenterology

## 2011-02-03 ENCOUNTER — Other Ambulatory Visit (AMBULATORY_SURGERY_CENTER): Payer: PRIVATE HEALTH INSURANCE | Admitting: Gastroenterology

## 2011-02-03 DIAGNOSIS — Z800 Family history of malignant neoplasm of digestive organs: Secondary | ICD-10-CM

## 2011-02-03 DIAGNOSIS — K620 Anal polyp: Secondary | ICD-10-CM

## 2011-02-03 DIAGNOSIS — K621 Rectal polyp: Secondary | ICD-10-CM

## 2011-02-03 DIAGNOSIS — D126 Benign neoplasm of colon, unspecified: Secondary | ICD-10-CM

## 2011-02-03 DIAGNOSIS — Z1211 Encounter for screening for malignant neoplasm of colon: Secondary | ICD-10-CM

## 2011-02-09 NOTE — Procedures (Addendum)
Summary: Colonoscopy  Patient: Dawn Juarez Note: All result statuses are Final unless otherwise noted.  Tests: (1) Colonoscopy (COL)   COL Colonoscopy           DONE     [facility]     [address]     [place], [place]  [address]          COLONOSCOPY PROCEDURE REPORT          PATIENT:  Dawn Juarez, Dawn Juarez  MR#:  [id number]     BIRTHDATE:  07-25-1966, 44 yrs. old  GENDER:  female     ENDOSCOPIST:  Dawn Rea. Jarold Motto, MD, [facility]     REF. BY:  Dawn Juarez, F.N.P.-B.C.     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     MEDICATIONS:   Fentanyl 75 mcg IV, Versed 10 mg IV          DESCRIPTION OF PROCEDURE:   After the risks benefits and     alternatives of the procedure were thoroughly explained, informed     consent was obtained.  Digital rectal exam was performed and     revealed no abnormalities.   The LB [id number] endoscope     was introduced through the anus and advanced to the cecum, which     was identified by both the appendix and ileocecal valve, without     limitations.  The quality of the prep was excellent, using     MoviPrep.  The instrument was then slowly withdrawn as the colon     was fully examined.     <<PROCEDUREIMAGES>>          FINDINGS:  A diminutive polyp was found in the rectum. RECTAL     NODULE COLD SNARE EXCISED.SEE PICTURES.  This was otherwise a     normal examination of the colon.   Retroflexed views in the rectum     revealed no abnormalities.    The scope was then withdrawn from     the patient and the procedure completed.          COMPLICATIONS:  None     ENDOSCOPIC IMPRESSION:     1) Diminutive polyp in the rectum     2) Otherwise normal examination     R/O ADENOMA.     RECOMMENDATIONS:     1) Given your significant family history of colon cancer, you     should have a repeat colonoscopy in 5 years     REPEAT EXAM:  No        ______________________________     Dawn Rea. Jarold Motto, MD, [facility]          CC:          n.     eSIGNED:   Vania Rea. Josefita Juarez at 02/03/2011 12:22 PM          Juarez, Dawn Juarez, [phone]  Note: An exclamation mark (!) indicates a result that was not dispersed into the flowsheet. Document Creation Date: 02/03/2011 12:22 PM _______________________________________________________________________  (1) Order result status: Final Collection or observation date-time: 02/03/2011 12:16 Requested date-time:  Receipt date-time:  Reported date-time:  Referring Physician:   Ordering Physician: Dawn Juarez [phone]) Specimen Source:  Source: Dawn Juarez Order Number: [id number] Lab site:   Appended Document: Colonoscopy     Procedures Next  Due Date:    Colonoscopy: 01/2016

## 2011-02-15 ENCOUNTER — Encounter: Payer: Self-pay | Admitting: Gastroenterology

## 2011-02-16 LAB — COMPREHENSIVE METABOLIC PANEL
ALT: 321 U/L — ABNORMAL HIGH (ref 0–35)
ALT: 387 U/L — ABNORMAL HIGH (ref 0–35)
AST: 105 U/L — ABNORMAL HIGH (ref 0–37)
AST: 122 U/L — ABNORMAL HIGH (ref 0–37)
AST: 131 U/L — ABNORMAL HIGH (ref 0–37)
AST: 135 U/L — ABNORMAL HIGH (ref 0–37)
Albumin: 3.2 g/dL — ABNORMAL LOW (ref 3.5–5.2)
Albumin: 3.3 g/dL — ABNORMAL LOW (ref 3.5–5.2)
Albumin: 4.1 g/dL (ref 3.5–5.2)
Alkaline Phosphatase: 144 U/L — ABNORMAL HIGH (ref 39–117)
Alkaline Phosphatase: 217 U/L — ABNORMAL HIGH (ref 39–117)
BUN: 10 mg/dL (ref 6–23)
BUN: 6 mg/dL (ref 6–23)
BUN: 7 mg/dL (ref 6–23)
BUN: 7 mg/dL (ref 6–23)
CO2: 23 mEq/L (ref 19–32)
CO2: 26 mEq/L (ref 19–32)
CO2: 26 mEq/L (ref 19–32)
CO2: 28 mEq/L (ref 19–32)
Calcium: 8.9 mg/dL (ref 8.4–10.5)
Calcium: 9 mg/dL (ref 8.4–10.5)
Calcium: 9.7 mg/dL (ref 8.4–10.5)
Chloride: 101 mEq/L (ref 96–112)
Chloride: 103 mEq/L (ref 96–112)
Chloride: 104 mEq/L (ref 96–112)
Creatinine, Ser: 0.57 mg/dL (ref 0.4–1.2)
Creatinine, Ser: 0.71 mg/dL (ref 0.4–1.2)
Creatinine, Ser: 0.72 mg/dL (ref 0.4–1.2)
GFR calc Af Amer: >60 mL/min (ref >60)
GFR calc Af Amer: >60 mL/min (ref >60)
GFR calc non Af Amer: >60 mL/min (ref >60)
GFR calc non Af Amer: >60 mL/min (ref >60)
GFR calc non Af Amer: >60 mL/min (ref >60)
Glucose, Bld: 83 mg/dL (ref 70–99)
Glucose, Bld: 86 mg/dL (ref 70–99)
Potassium: 3.6 mEq/L (ref 3.5–5.1)
Sodium: 137 mEq/L (ref 135–145)
Total Bilirubin: 1.9 mg/dL — ABNORMAL HIGH (ref 0.3–1.2)
Total Bilirubin: 2.8 mg/dL — ABNORMAL HIGH (ref 0.3–1.2)
Total Bilirubin: 6 mg/dL — ABNORMAL HIGH (ref 0.3–1.2)
Total Bilirubin: 6.6 mg/dL — ABNORMAL HIGH (ref 0.3–1.2)
Total Protein: 7.1 g/dL (ref 6.0–8.3)
Total Protein: 7.2 g/dL (ref 6.0–8.3)

## 2011-02-16 LAB — CBC
HCT: 37.9 % (ref 36.0–46.0)
HCT: 38 % (ref 36.0–46.0)
HCT: 38.7 % (ref 36.0–46.0)
HCT: 42.5 % (ref 36.0–46.0)
Hemoglobin: 13 g/dL (ref 12.0–15.0)
Hemoglobin: 14.1 g/dL (ref 12.0–15.0)
MCHC: 33.1 g/dL (ref 30.0–36.0)
MCHC: 33.5 g/dL (ref 30.0–36.0)
MCV: 83.8 fL (ref 78.0–100.0)
MCV: 84.3 fL (ref 78.0–100.0)
MCV: 84.5 fL (ref 78.0–100.0)
MCV: 84.6 fL (ref 78.0–100.0)
Platelets: 299 10*3/uL (ref 150–400)
RBC: 4.19 MIL/uL (ref 3.87–5.11)
RBC: 4.49 MIL/uL (ref 3.87–5.11)
RBC: 4.5 MIL/uL (ref 3.87–5.11)
RBC: 5.02 MIL/uL (ref 3.87–5.11)
RDW: 16.9 % — ABNORMAL HIGH (ref 11.5–15.5)
RDW: 16.9 % — ABNORMAL HIGH (ref 11.5–15.5)
RDW: 16.9 % — ABNORMAL HIGH (ref 11.5–15.5)
RDW: 17.3 % — ABNORMAL HIGH (ref 11.5–15.5)
WBC: 11.7 10*3/uL — ABNORMAL HIGH (ref 4.0–10.5)
WBC: 8.6 10*3/uL (ref 4.0–10.5)
WBC: 8.8 10*3/uL (ref 4.0–10.5)

## 2011-02-16 LAB — DIFFERENTIAL
Eosinophils Absolute: 0.1 10*3/uL (ref 0.0–0.7)
Lymphs Abs: 1.6 10*3/uL (ref 0.7–4.0)
Neutro Abs: 6.6 10*3/uL (ref 1.7–7.7)
Neutrophils Relative %: 74 % (ref 43–77)

## 2011-02-16 LAB — HEPATIC FUNCTION PANEL
ALT: 343 U/L — ABNORMAL HIGH (ref 0–35)
AST: 146 U/L — ABNORMAL HIGH (ref 0–37)
Alkaline Phosphatase: 213 U/L — ABNORMAL HIGH (ref 39–117)
Indirect Bilirubin: 2.5 mg/dL — ABNORMAL HIGH (ref 0.3–0.9)
Total Bilirubin: 6.5 mg/dL — ABNORMAL HIGH (ref 0.3–1.2)

## 2011-02-16 LAB — ABO/RH: ABO/RH(D): O POS

## 2011-02-16 LAB — TYPE AND SCREEN: Antibody Screen: NEGATIVE

## 2011-02-16 LAB — URINALYSIS, ROUTINE W REFLEX MICROSCOPIC
Glucose, UA: NEGATIVE mg/dL
pH: 5.5 (ref 5.0–8.0)

## 2011-02-16 LAB — URINE MICROSCOPIC-ADD ON

## 2011-02-16 LAB — LIPASE, BLOOD: Lipase: 24 U/L (ref 11–59)

## 2011-02-25 NOTE — Letter (Signed)
Summary: Patient Notice- Polyp Results  [facility]  [address] [place], [place] [address]   Phone: [phone]  Fax: [phone]        February 15, 2011 MRN: [id number]    [facility] [address] [place], [place]  [address]    Dear Dawn Juarez,  I am pleased to inform you that the colon polyp(s) removed during your recent colonoscopy was (were) found to be benign (no cancer detected) upon pathologic examination. followup recommended per your family history of colon cancer.  I recommend you have a repeat colonoscopy examination in 5_ years to look for recurrent polyps, as having colon polyps increases your risk for having recurrent polyps or even colon cancer in the future.  Should you develop new or worsening symptoms of abdominal pain, bowel habit changes or bleeding from the rectum or bowels, please schedule an evaluation with either your primary care physician or with me.  Additional information/recommendations:  _x_ No further action with gastroenterology is needed at this time. Please      follow-up with your primary care physician for your other healthcare      needs.  __ Please call [phone] to schedule a return visit to review your      situation.  __ Please keep your follow-up visit as already scheduled.  __ Continue treatment plan as outlined the day of your exam.  Please call us if you are having persistent problems or have questions about your condition that have not been fully answered at this time.  Sincerely,  Mardella Layman MD [facility]  This letter has been electronically signed by your physician.  Appended Document: Patient Notice- Polyp Results letter mailed

## 2014-02-20 ENCOUNTER — Emergency Department (HOSPITAL_COMMUNITY)
Admission: EM | Admit: 2014-02-20 | Discharge: 2014-02-20 | Disposition: A | Discharge status: Home / Self Care | Payer: BC Managed Care – PPO | Attending: Emergency Medicine | Admitting: Emergency Medicine

## 2014-02-20 ENCOUNTER — Encounter (HOSPITAL_COMMUNITY): Payer: Self-pay | Admitting: Emergency Medicine

## 2014-02-20 DIAGNOSIS — I82409 Acute embolism and thrombosis of unspecified deep veins of unspecified lower extremity: Secondary | ICD-10-CM

## 2014-02-20 DIAGNOSIS — M7989 Other specified soft tissue disorders: Secondary | ICD-10-CM

## 2014-02-20 DIAGNOSIS — E039 Hypothyroidism, unspecified: Secondary | ICD-10-CM | POA: Insufficient documentation

## 2014-02-20 DIAGNOSIS — F411 Generalized anxiety disorder: Secondary | ICD-10-CM | POA: Insufficient documentation

## 2014-02-20 DIAGNOSIS — I824Y9 Acute embolism and thrombosis of unspecified deep veins of unspecified proximal lower extremity: Secondary | ICD-10-CM | POA: Insufficient documentation

## 2014-02-20 DIAGNOSIS — I10 Essential (primary) hypertension: Secondary | ICD-10-CM | POA: Insufficient documentation

## 2014-02-20 DIAGNOSIS — R000 Tachycardia, unspecified: Secondary | ICD-10-CM | POA: Insufficient documentation

## 2014-02-20 HISTORY — DX: Essential (primary) hypertension: I10

## 2014-02-20 HISTORY — DX: Anxiety disorder, unspecified: F41.9

## 2014-02-20 HISTORY — DX: Hypothyroidism, unspecified: E03.9

## 2014-02-20 LAB — CBC
HEMATOCRIT: 27.8 % — AB (ref 36.0–46.0)
Hemoglobin: 8.8 g/dL — ABNORMAL LOW (ref 12.0–15.0)
MCH: 25.6 pg — AB (ref 26.0–34.0)
MCHC: 31.7 g/dL (ref 30.0–36.0)
MCV: 80.8 fL (ref 78.0–100.0)
Platelets: 294 10*3/uL (ref 150–400)
RBC: 3.44 MIL/uL — AB (ref 3.87–5.11)
RDW: 18.6 % — AB (ref 11.5–15.5)
WBC: 13.3 10*3/uL — AB (ref 4.0–10.5)

## 2014-02-20 LAB — BASIC METABOLIC PANEL
BUN: 11 mg/dL (ref 6–23)
CHLORIDE: 96 meq/L (ref 96–112)
CO2: 29 meq/L (ref 19–32)
CREATININE: 1.32 mg/dL — AB (ref 0.50–1.10)
Calcium: 8.6 mg/dL (ref 8.4–10.5)
GFR calc Af Amer: 55 mL/min — ABNORMAL LOW (ref >90)
GFR calc non Af Amer: 48 mL/min — ABNORMAL LOW (ref >90)
Glucose, Bld: 88 mg/dL (ref 70–99)
Potassium: 4.3 mEq/L (ref 3.7–5.3)
Sodium: 137 mEq/L (ref 137–147)

## 2014-02-20 MED ORDER — ENOXAPARIN SODIUM 150 MG/ML ~~LOC~~ SOLN: 150.0000 mg | SUBCUTANEOUS | Status: AC

## 2014-02-20 MED ORDER — OXYCODONE HCL 5 MG PO TABS: 5.0000 mg | ORAL_TABLET | ORAL | Status: AC | PRN

## 2014-02-20 MED ORDER — ENOXAPARIN (LOVENOX) PATIENT EDUCATION KIT
PACK | Freq: Once | Status: DC
  Filled 2014-02-20: qty 1

## 2014-02-20 MED ORDER — ENOXAPARIN SODIUM 100 MG/ML ~~LOC~~ SOLN
100.0000 mg | Freq: Once | SUBCUTANEOUS | Status: AC
  Administered 2014-02-20: 100 mg via SUBCUTANEOUS
  Filled 2014-02-20: qty 1

## 2014-02-20 NOTE — Discharge Instructions (Signed)
Deep Vein Thrombosis  A deep vein thrombosis (DVT) is a blood clot that develops in the deep, larger veins of the leg, arm, or pelvis. These are more dangerous than clots that might form in veins near the surface of the body. A DVT can lead to complications if the clot breaks off and travels in the bloodstream to the lungs.   A DVT can damage the valves in your leg veins, so that instead of flowing upward, the blood pools in the lower leg. This is called post-thrombotic syndrome, and it can result in pain, swelling, discoloration, and sores on the leg.  CAUSES  Usually, several things contribute to blood clots forming. Contributing factors include:   The flow of blood slows down.   The inside of the vein is damaged in some way.   You have a condition that makes blood clot more easily.  RISK FACTORS  Some people are more likely than others to develop blood clots. Risk factors include:    Older age, especially over 75 years of age.   Having a family history of blood clots or if you have already had a blot clot.   Having major or lengthy surgery. This is especially true for surgery on the hip, knee, or belly (abdomen). Hip surgery is particularly high risk.   Breaking a hip or leg.   Sitting or lying still for a long time. This includes long-distance travel, paralysis, or recovery from an illness or surgery.   Having cancer or cancer treatment.   Having a long, thin tube (catheter) placed inside a vein during a medical procedure.   Being overweight (obese).   Pregnancy and childbirth.   Hormone changes make the blood clot more easily during pregnancy.   The fetus puts pressure on the veins of the pelvis.   There is a risk of injury to veins during delivery or a caesarean. The risk is highest just after childbirth.   Medicines with the female hormone estrogen. This includes birth control pills and hormone replacement therapy.   Smoking.   Other circulation or heart problems.    SIGNS AND SYMPTOMS  When  a clot forms, it can either partially or totally block the blood flow in that vein. Symptoms of a DVT can include:   Swelling of the leg or arm, especially if one side is much worse.   Warmth and redness of the leg or arm, especially if one side is much worse.   Pain in an arm or leg. If the clot is in the leg, symptoms may be more noticeable or worse when standing or walking.  The symptoms of a DVT that has traveled to the lungs (pulmonary embolism, PE) usually start suddenly and include:   Shortness of breath.   Coughing.   Coughing up blood or blood-tinged phlegm.   Chest pain. The chest pain is often worse with deep breaths.   Rapid heartbeat.  Anyone with these symptoms should get emergency medical treatment right away. Call your local emergency services (911 in the [place] if you have these symptoms.  DIAGNOSIS  If a DVT is suspected, your health care provider will take a full medical history and perform a physical exam. Tests that also may be required include:   Blood tests, including studies of the clotting properties of the blood.   Ultrasonography to see if you have clots in your legs or lungs.   X-rays to show the flow of blood when dye is injected into the veins (  venography).   Studies of your lungs if you have any chest symptoms.  PREVENTION   Exercise the legs regularly. Take a brisk 30-minute walk every day.   Maintain a weight that is appropriate for your height.   Avoid sitting or lying in bed for long periods of time without moving your legs.   Women, particularly those over the age of 35 years, should consider the risks and benefits of taking estrogen medicines, including birth control pills.   Do not smoke, especially if you take estrogen medicines.   Long-distance travel can increase your risk of DVT. You should exercise your legs by walking or pumping the muscles every hour.   In-hospital prevention:   Many of the risk factors above relate to situations that exist with  hospitalization, either for illness, injury, or elective surgery.   Your health care provider will assess you for the need for venous thromboembolism prophylaxis when you are admitted to the hospital. If you are having surgery, your surgeon will assess you the day of or day after surgery.   Prevention may include medical and nonmedical measures.  TREATMENT  Once identified, a DVT can be treated. It can also be prevented in some circumstances. Once you have had a DVT, you may be at increased risk for a DVT in the future. The most common treatment for DVT is blood thinning (anticoagulant) medicine, which reduces the blood's tendency to clot. Anticoagulants can stop new blood clots from forming and stop old ones from growing. They cannot dissolve existing clots. Your body does this by itself over time. Anticoagulants can be given by mouth, by IV access, or by injection. Your health care provider will determine the best program for you. Other medicines or treatments that may be used are:   Heparin or related medicines (low molecular weight heparin) are usually the first treatment for a blood clot. They act quickly. However, they cannot be taken orally.   Heparin can cause a fall in a component of blood that stops bleeding and forms blood clots (platelets). You will be monitored with blood tests to be sure this does not occur.   Warfarin is an anticoagulant that can be swallowed. It takes a few days to start working, so usually heparin or related medicines are used in combination. Once warfarin is working, heparin is usually stopped.   Less commonly, clot dissolving drugs (thrombolytics) are used to dissolve a DVT. They carry a high risk of bleeding, so they are used mainly in severe cases, where your life or a limb is threatened.   Very rarely, a blood clot in the leg needs to be removed surgically.   If you are unable to take anticoagulants, your health care provider may arrange for you to have a filter placed  in a main vein in your abdomen. This filter prevents clots from traveling to your lungs.  HOME CARE INSTRUCTIONS   Take all medicines prescribed by your health care provider. Only take over-the-counter or prescription medicines for pain, fever, or discomfort as directed by your health care provider.   Warfarin. Most people will continue taking warfarin after hospital discharge. Your health care provider will advise you on the length of treatment (usually 3 6 months, sometimes lifelong).   Too much and too little warfarin are both dangerous. Too much warfarin increases the risk of bleeding. Too little warfarin continues to allow the risk for blood clots. While taking warfarin, you will need to have regular blood tests to measure your   blood clotting time. These blood tests usually include both the prothrombin time (PT) and international normalized ratio (INR) tests. The PT and INR results allow your health care provider to adjust your dose of warfarin. The dose can change for many reasons. It is critically important that you take warfarin exactly as prescribed, and that you have your PT and INR levels drawn exactly as directed.   Many foods, especially foods high in vitamin K, can interfere with warfarin and affect the PT and INR results. Foods high in vitamin K include spinach, kale, broccoli, cabbage, collard and turnip greens, brussel sprouts, peas, cauliflower, seaweed, and parsley as well as beef and pork liver, green tea, and soybean oil. You should eat a consistent amount of foods high in vitamin K. Avoid major changes in your diet, or notify your health care provider before changing your diet. Arrange a visit with a dietitian to answer your questions.   Many medicines can interfere with warfarin and affect the PT and INR results. You must tell your health care provider about any and all medicines you take. This includes all vitamins and supplements. Be especially cautious with aspirin and  anti-inflammatory medicines. Ask your health care provider before taking these. Do not take or discontinue any prescribed or over-the-counter medicine except on the advice of your health care provider or pharmacist.   Warfarin can have side effects, primarily excessive bruising or bleeding. You will need to hold pressure over cuts for longer than usual. Your health care provider or pharmacist will discuss other potential side effects.   Alcohol can change the body's ability to handle warfarin. It is best to avoid alcoholic drinks or consume only very small amounts while taking warfarin. Notify your health care provider if you change your alcohol intake.   Notify your dentist or other health care providers before procedures.   Activity. Ask your health care provider how soon you can go back to normal activities. It is important to stay active to prevent blood clots. If you are on anticoagulant medicine, avoid contact sports.   Exercise. It is very important to exercise. This is especially important while traveling, sitting, or standing for long periods of time. Exercise your legs by walking or by pumping the muscles frequently. Take frequent walks.   Compression stockings. These are tight elastic stockings that apply pressure to the lower legs. This pressure can help keep the blood in the legs from clotting. You may need to wear compression stockings at home to help prevent a DVT.   Do not smoke. If you smoke, quit. Ask your health care provider for help with quitting smoking.   Learn as much as you can about DVT. Knowing more about the condition should help you keep it from coming back.   Wear a medical alert bracelet or carry a medical alert card.  SEEK MEDICAL CARE IF:   You notice a rapid heartbeat.   You feel weaker or more tired than usual.   You feel faint.   You notice increased bruising.   You feel your symptoms are not getting better in the time expected.   You believe you are having side  effects of medicine.  SEEK IMMEDIATE MEDICAL CARE IF:   You have chest pain.   You have trouble breathing.   You have new or increased swelling or pain in one leg.   You cough up blood.   You notice blood in vomit, in a bowel movement, or in urine.  MAKE SURE   YOU:   Understand these instructions.   Will watch your condition.   Will get help right away if you are not doing well or get worse.  Document Released: 11/15/2005 Document Revised: 09/05/2013 Document Reviewed: 07/23/2013  [employer] Patient Information 2014 [employer].

## 2014-02-20 NOTE — ED Provider Notes (Signed)
CSN: [id number]     Arrival date & time 02/20/14  1159 History   First MD Initiated Contact with Patient 02/20/14 1614     This chart was scribed for non-physician practitioner, Charlann Lange PA-C, working with Blanchard Kelch, MD by Forrestine Him, ED Scribe. This patient was seen in room TR10C/TR10C and the patient's care was started at 4:32 PM.   Chief Complaint  Patient presents with  . Leg Pain  . Leg Swelling   The history is provided by the patient. No language interpreter was used.    HPI Comments: Dawn Juarez is a 48 y.o. female who presents to the Emergency Department complaining of bilateral lower extremity pain (left greater than right) x 2 days. Pt also reports associated swelling to the lower extremities and describes it as "serran wrap feeling". Pt had a recent hysterectomy and was discharged about 2 days ago. Pt states her pain is exacerbated by ambulation. Pt states she noted swelling and pain to both legs after going home from the hospital. Pt was sent for evaluation for DVT today. She has taken an Oxycodone for pain with improvement. At this time she denies any SOB or CP. No other concerns this visit.  Past Medical History  Diagnosis Date  . Hypertension   . Hypothyroid   . Anxiety    Past Surgical History  Procedure Laterality Date  . Abdominal hysterectomy     History reviewed. No pertinent family history. History  Substance Use Topics  . Smoking status: Never Smoker   . Smokeless tobacco: Not on file  . Alcohol Use: No   OB History   Grav Para Term Preterm Abortions TAB SAB Ect Mult Living                 Review of Systems  Constitutional: Negative for fever and chills.  Respiratory: Negative for shortness of breath.   Cardiovascular: Positive for leg swelling. Negative for chest pain.  Musculoskeletal: Negative for myalgias.      Allergies  Shellfish allergy; Amlodipine; Beta adrenergic blockers; and Lisinopril  Home Medications   Current  Outpatient Rx  Name  Route  Sig  Dispense  Refill  . CALCIUM PO   Oral   Take 1 tablet by mouth daily.         . Cholecalciferol (VITAMIN D-3 PO)   Oral   Take 1 tablet by mouth daily.         . diazepam (VALIUM) 5 MG tablet   Oral   Take 5 mg by mouth every 6 (six) hours as needed for anxiety.         Marland Kitchen levothyroxine (SYNTHROID, LEVOTHROID) 125 MCG tablet   Oral   Take 125 mcg by mouth daily before breakfast.         . oxyCODONE-acetaminophen (PERCOCET/ROXICET) 5-325 MG per tablet   Oral   Take 1 tablet by mouth every 4 (four) hours as needed (pain).          Triage Vitals: BP 120/83  Pulse 113  Temp(Src) 98 F (36.7 C) (Oral)  Resp 14  SpO2 97%   Physical Exam  Nursing note and vitals reviewed. Constitutional: She is oriented to person, place, and time. She appears well-developed and well-nourished.  HENT:  Head: Normocephalic and atraumatic.  Eyes: EOM are normal.  Neck: Normal range of motion.  Cardiovascular: Regular rhythm.  Tachycardia present.   Pulmonary/Chest: Effort normal.  Musculoskeletal: Normal range of motion. She exhibits edema.  bilateral lower  extremity swelling without redness or warm palpable pulses bileral lower extremities No palpable tenderness to either leg Left calf tense without mass No thigh tenderness  Neurological: She is alert and oriented to person, place, and time.  Skin: Skin is warm and dry.  Psychiatric: She has a normal mood and affect. Her behavior is normal.    ED Course  Procedures (including critical care time)  DIAGNOSTIC STUDIES: Oxygen Saturation is 97% on RA, Normal by my interpretation.    COORDINATION OF CARE: 4:50 PM- Will order lower extremity doppler. Discussed treatment plan with pt at bedside and pt agreed to plan.     Labs Review Labs Reviewed - No data to display Imaging Review No results found.   EKG Interpretation None      MDM   Final diagnoses:  None    1. Left DVT 2.  Post-operative complication  DVT study positive for clot in left CFV and proximal femoral vein. Lovenox ordered per pharmacy. Dr. Claiborne Billings (GYN oncology at [facility] who performed surgery) advises Lovenox and discharge home is appropriate, and his office will make contact with her tomorrow.   Pharmacy involved in educating the patient on Lovenox use/injections. Patient is stable for discharge.   I personally performed the services described in this documentation, which was scribed in my presence. The recorded information has been reviewed and is accurate.     Dewaine Oats, PA-C 02/20/14 2001

## 2014-02-20 NOTE — Progress Notes (Signed)
*  PRELIMINARY RESULTS* Vascular Ultrasound Lower extremity venous duplex has been completed.  Preliminary findings: Technically limited study due to edema. Right = no obvious evidence of DVT. Left = DVT involving the CFV and proximal FV. Poorly visualized mid to distal FV. Patent Popliteal and calf veins.   Landry Mellow, RDMS, RVT  02/20/2014, 5:25 PM

## 2014-02-20 NOTE — ED Notes (Signed)
Paged Dr. Rayford Halsted to [phone]

## 2014-02-20 NOTE — ED Notes (Signed)
Pt c/o bilateral leg pain with swelling; pt had recent sx and hysterectomy with tumor removal; pt sent here for eval for DVT; pt sts left leg painful and more swelling than right

## 2014-02-20 NOTE — Progress Notes (Addendum)
ANTICOAGULATION CONSULT NOTE - Initial Consult  Pharmacy Consult for Lovenox Indication: DVT  Allergies  Allergen Reactions  . Shellfish Allergy Anaphylaxis  . Amlodipine Other (See Comments)    cough  . Beta Adrenergic Blockers Other (See Comments)    unknown  . Lisinopril Other (See Comments)    cough    Patient Measurements:   Heparin Dosing Weight: n/a  Vital Signs: Temp: 97.4 F (36.3 C) (03/25 1754) Temp src: Oral (03/25 1754) BP: 120/68 mmHg (03/25 1754) Pulse Rate: 108 (03/25 1754)  Labs: No results found for this basename: HGB, HCT, PLT, APTT, LABPROT, INR, HEPARINUNFRC, CREATININE, CKTOTAL, CKMB, TROPONINI,  in the last 72 hours  The CrCl is unknown because both a height and weight (above a minimum accepted value) are required for this calculation.   Medical History: Past Medical History  Diagnosis Date  . Hypertension   . Hypothyroid   . Anxiety     Medications:   (Not in a hospital admission)  Assessment: 42 YOF who presents to ED with c/o bilateral leg pain and swelling. Preliminary U/S shows DVT in LLE with no obvious evidence of right DVT. Pharmacy to start Lovenox. Patient states she weighed 109 kg prior to her tumor removal and she is unsure what she weighs now.   Goal of Therapy:  Anti-Xa level 0.6-1 units/ml 4hrs after LMWH dose given Monitor platelets by anticoagulation protocol: Yes   Plan:  1) Give Lovenox 100 mg (1 mg/kg) x 1 dose now. F/u maintenance dose after labs have been collected and renal fx determined  2) Monitor CBC (Q 72 hours), renal fx and s/s of bleeding   Albertina Parr, PharmD.  Clinical Pharmacist Pager [phone]   Addendum: Educated patient on monitoring parameters for Lovenox. Hgb 8.8 (likely post op anemia), Plt 294. No bleeding observed. SCr 1.32.   Plan: 1) Recommend discharging the patient on Lovenox 150 mg [place] daily starting tomorrow morning   Albertina Parr, PharmD.  Clinical Pharmacist Pager  [phone]

## 2014-02-21 NOTE — ED Provider Notes (Signed)
Medical screening examination/treatment/procedure(s) were conducted as a shared visit with non-physician practitioner(s) and myself.  I personally evaluated the patient during the encounter.   EKG Interpretation None      I interviewed and examined the patient. Lungs are CTAB. Cardiac exam wnl. Abdomen soft.  Pt found to have DVT. On call Gyn-Onc contacted at [facility] to discuss the pt. Lovenox and d/c recommended. Pt appears well on my exam. Denies cp or sob. Will d/c and recommed f/u w/ her gyn-onc physician.   Blanchard Kelch, MD 02/21/14 [phone]

## 2014-03-18 ENCOUNTER — Encounter (HOSPITAL_BASED_OUTPATIENT_CLINIC_OR_DEPARTMENT_OTHER): Payer: BC Managed Care – PPO | Attending: Plastic Surgery

## 2014-03-18 ENCOUNTER — Telehealth: Payer: Self-pay | Admitting: *Deleted

## 2014-03-18 NOTE — Telephone Encounter (Signed)
Called pt after speaking with Dr. Evette Georges office regarding pt referral spoke with June Williams who took message for pt as she was actively in Treatment. Gave June appt 04/12/14 at 830am. June verbalized Dawn Juarez will be here.

## 2014-03-20 ENCOUNTER — Encounter (HOSPITAL_COMMUNITY): Payer: Self-pay | Admitting: Emergency Medicine

## 2014-03-20 ENCOUNTER — Emergency Department (HOSPITAL_COMMUNITY)
Admission: EM | Admit: 2014-03-20 | Discharge: 2014-03-20 | Disposition: A | Discharge status: Home / Self Care | Payer: BC Managed Care – PPO | Attending: Emergency Medicine | Admitting: Emergency Medicine

## 2014-03-20 ENCOUNTER — Emergency Department (HOSPITAL_COMMUNITY): Payer: BC Managed Care – PPO

## 2014-03-20 DIAGNOSIS — R109 Unspecified abdominal pain: Secondary | ICD-10-CM | POA: Insufficient documentation

## 2014-03-20 DIAGNOSIS — Z7901 Long term (current) use of anticoagulants: Secondary | ICD-10-CM | POA: Insufficient documentation

## 2014-03-20 DIAGNOSIS — C569 Malignant neoplasm of unspecified ovary: Secondary | ICD-10-CM | POA: Diagnosis present

## 2014-03-20 DIAGNOSIS — I10 Essential (primary) hypertension: Secondary | ICD-10-CM | POA: Insufficient documentation

## 2014-03-20 DIAGNOSIS — E039 Hypothyroidism, unspecified: Secondary | ICD-10-CM | POA: Insufficient documentation

## 2014-03-20 DIAGNOSIS — R55 Syncope and collapse: Secondary | ICD-10-CM | POA: Insufficient documentation

## 2014-03-20 DIAGNOSIS — Z79899 Other long term (current) drug therapy: Secondary | ICD-10-CM | POA: Insufficient documentation

## 2014-03-20 DIAGNOSIS — Z8543 Personal history of malignant neoplasm of ovary: Secondary | ICD-10-CM | POA: Insufficient documentation

## 2014-03-20 DIAGNOSIS — Z90710 Acquired absence of both cervix and uterus: Secondary | ICD-10-CM | POA: Insufficient documentation

## 2014-03-20 DIAGNOSIS — F411 Generalized anxiety disorder: Secondary | ICD-10-CM | POA: Insufficient documentation

## 2014-03-20 DIAGNOSIS — Z86718 Personal history of other venous thrombosis and embolism: Secondary | ICD-10-CM | POA: Insufficient documentation

## 2014-03-20 HISTORY — DX: Malignant neoplasm of unspecified ovary: C56.9

## 2014-03-20 LAB — URINALYSIS, ROUTINE W REFLEX MICROSCOPIC
BILIRUBIN URINE: NEGATIVE
Glucose, UA: NEGATIVE mg/dL
HGB URINE DIPSTICK: NEGATIVE
Ketones, ur: NEGATIVE mg/dL
Leukocytes, UA: NEGATIVE
NITRITE: NEGATIVE
PH: 6.5 (ref 5.0–8.0)
Protein, ur: NEGATIVE mg/dL
SPECIFIC GRAVITY, URINE: 1.008 (ref 1.005–1.030)
UROBILINOGEN UA: 0.2 mg/dL (ref 0.0–1.0)

## 2014-03-20 LAB — BASIC METABOLIC PANEL
BUN: 29 mg/dL — AB (ref 6–23)
CHLORIDE: 96 meq/L (ref 96–112)
CO2: 24 mEq/L (ref 19–32)
Calcium: 9 mg/dL (ref 8.4–10.5)
Creatinine, Ser: 1.05 mg/dL (ref 0.50–1.10)
GFR calc Af Amer: 73 mL/min — ABNORMAL LOW (ref >90)
GFR, EST NON AFRICAN AMERICAN: 63 mL/min — AB (ref >90)
GLUCOSE: 104 mg/dL — AB (ref 70–99)
POTASSIUM: 4.7 meq/L (ref 3.7–5.3)
SODIUM: 134 meq/L — AB (ref 137–147)

## 2014-03-20 LAB — CBC WITH DIFFERENTIAL/PLATELET
Basophils Absolute: 0 10*3/uL (ref 0.0–0.1)
Basophils Relative: 0 % (ref 0–1)
Eosinophils Absolute: 0 10*3/uL (ref 0.0–0.7)
Eosinophils Relative: 1 % (ref 0–5)
HEMATOCRIT: 26.7 % — AB (ref 36.0–46.0)
Hemoglobin: 8.2 g/dL — ABNORMAL LOW (ref 12.0–15.0)
LYMPHS ABS: 1 10*3/uL (ref 0.7–4.0)
LYMPHS PCT: 11 % — AB (ref 12–46)
MCH: 24.4 pg — ABNORMAL LOW (ref 26.0–34.0)
MCHC: 30.7 g/dL (ref 30.0–36.0)
MCV: 79.5 fL (ref 78.0–100.0)
MONOS PCT: 1 % — AB (ref 3–12)
Monocytes Absolute: 0.1 10*3/uL (ref 0.1–1.0)
NEUTROS ABS: 7.6 10*3/uL (ref 1.7–7.7)
NEUTROS PCT: 87 % — AB (ref 43–77)
PLATELETS: 399 10*3/uL (ref 150–400)
RBC: 3.36 MIL/uL — ABNORMAL LOW (ref 3.87–5.11)
RDW: 18.5 % — ABNORMAL HIGH (ref 11.5–15.5)
WBC: 8.7 10*3/uL (ref 4.0–10.5)

## 2014-03-20 LAB — I-STAT CG4 LACTIC ACID, ED: LACTIC ACID, VENOUS: 0.84 mmol/L (ref 0.5–2.2)

## 2014-03-20 MED ORDER — SODIUM CHLORIDE 0.9 % IV BOLUS (SEPSIS)
1000.0000 mL | Freq: Once | INTRAVENOUS | Status: AC
  Administered 2014-03-20: 1000 mL via INTRAVENOUS

## 2014-03-20 MED ORDER — HEPARIN SOD (PORK) LOCK FLUSH 100 UNIT/ML IV SOLN
500.0000 [IU] | Freq: Once | INTRAVENOUS | Status: AC
  Administered 2014-03-20: 500 [IU]
  Filled 2014-03-20: qty 5

## 2014-03-20 NOTE — ED Notes (Signed)
Patient transported to X-ray 

## 2014-03-20 NOTE — ED Notes (Signed)
MD at bedside. 

## 2014-03-20 NOTE — ED Notes (Signed)
Patient arrives via EMS due to c/o hypotension and abdominal pain  Stage 3 ovarian cancer patient--first chemo tx was two days ago Wound vac to abdomen in place BP 88/58 by home health nurse, so was sent here [facility] 128 Patient has right chest port in place

## 2014-03-20 NOTE — ED Notes (Signed)
Dr. Goldston at bedside.  

## 2014-03-20 NOTE — Discharge Instructions (Signed)
Near-Syncope °Near-syncope (commonly known as near fainting) is sudden weakness, dizziness, or feeling like you might pass out. During an episode of near-syncope, you may also develop pale skin, have tunnel vision, or feel sick to your stomach (nauseous). Near-syncope may occur when getting up after sitting or while standing for a long time. It is caused by a sudden decrease in blood flow to the brain. This decrease can result from various causes or triggers, most of which are not serious. However, because near-syncope can sometimes be a sign of something serious, a medical evaluation is required. The specific cause is often not determined. °HOME CARE INSTRUCTIONS  °Monitor your condition for any changes. The following actions may help to alleviate any discomfort you are experiencing: °· Have someone stay with you until you feel stable. °· Lie down right away if you start feeling like you might faint. Breathe deeply and steadily. Wait until all the symptoms have passed. Most of these episodes last only a few minutes. You may feel tired for several hours.   °· Drink enough fluids to keep your urine clear or pale yellow.   °· If you are taking blood pressure or heart medicine, get up slowly when seated or lying down. Take several minutes to sit and then stand. This can reduce dizziness. °· Follow up with your health care provider as directed.  °SEEK IMMEDIATE MEDICAL CARE IF:  °· You have a severe headache.   °· You have unusual pain in the chest, abdomen, or back.   °· You are bleeding from the mouth or rectum, or you have black or tarry stool.   °· You have an irregular or very fast heartbeat.   °· You have repeated fainting or have seizure-like jerking during an episode.   °· You faint when sitting or lying down.   °· You have confusion.   °· You have difficulty walking.   °· You have severe weakness.   °· You have vision problems.   °MAKE SURE YOU:  °· Understand these instructions. °· Will watch your  condition. °· Will get help right away if you are not doing well or get worse. °Document Released: 11/15/2005 Document Revised: 07/18/2013 Document Reviewed: 04/20/2013 °[employer]® Patient Information ©2014 [employer]. ° °

## 2014-03-20 NOTE — ED Notes (Signed)
Patient up to bathroom without difficulty or assistance.

## 2014-03-20 NOTE — ED Notes (Addendum)
Patient states that wound vac is in place due to dehiscence of wound when staples were removed s/p hysterectomy Patient reports that home health nurse comes to change dressing and assess wound site/wound vac Patient reports that she took tabs of Oxycodone prior to home health nurse arriving at house (one tab at 1530 and the other at 1400) Patient informed this nurse that she has a right chest port, but is not sure if it is a Conserv[employer], museum/gallery

## 2014-03-20 NOTE — ED Provider Notes (Signed)
CSN: [id number]     Arrival date & time 03/20/14  2008 History   First MD Initiated Contact with Patient 03/20/14 2030     Chief Complaint  Patient presents with  . Abdominal Pain     (Consider location/radiation/quality/duration/timing/severity/associated sxs/prior Treatment) HPI 48 year old female presents with low blood pressure from home. Patient's history of stage III ovarian cancer. She had her uterus removed and then a competition to the surgery causing wound dehiscence. She started chemotherapy 2 days ago and today was put back on a wound VAC. The patient's wound has been healing he states intermittently the wound VAC seems to cause her superficial pain. Is not hurting right now. After she underwent x-rays by her home health nurse she went to the bathroom she got out of the bathroom she states she felt lightheaded, flushed, and her blood pressure was checked and it was 80/50. To this she was sent here is to have IV fluids at the home. She denies any current shortness of breath, chest pain, vomiting, diarrhea, or abdominal pain. No urinary symptoms. She states she's been feeling deconditioned so she went outside to walk around and felt short of breath however when she came to rest felt better. She denies a shortness of breath or chest pain now. She states she believes is good she was pushing herself too hard.  Past Medical History  Diagnosis Date  . Hypertension   . Hypothyroid   . Anxiety   . Ovarian cancer    Past Surgical History  Procedure Laterality Date  . Abdominal hysterectomy     History reviewed. No pertinent family history. History  Substance Use Topics  . Smoking status: Never Smoker   . Smokeless tobacco: Not on file  . Alcohol Use: No   OB History   Grav Para Term Preterm Abortions TAB SAB Ect Mult Living                 Review of Systems  Constitutional: Negative for fever.  Respiratory: Negative for shortness of breath.   Cardiovascular: Negative for  chest pain.  Gastrointestinal: Negative for nausea, vomiting, abdominal pain and diarrhea.  Genitourinary: Negative for dysuria.  Neurological: Positive for dizziness and syncope (near syncope). Negative for weakness and headaches.  All other systems reviewed and are negative.     Allergies  Shellfish allergy; Amlodipine; Beta adrenergic blockers; and Lisinopril  Home Medications   Prior to Admission medications   Medication Sig Start Date End Date Taking? Authorizing Provider  diazepam (VALIUM) 5 MG tablet Take 5 mg by mouth every 6 (six) hours as needed for anxiety.   Yes Historical Provider, MD  enoxaparin (LOVENOX) 150 MG/ML injection Inject 1 mL (150 mg total) into the skin daily. 02/20/14  Yes Shari A Upstill, PA-C  hydrochlorothiazide (HYDRODIURIL) 25 MG tablet Take 25 mg by mouth daily.   Yes Historical Provider, MD  levothyroxine (SYNTHROID, LEVOTHROID) 125 MCG tablet Take 125 mcg by mouth daily before breakfast.   Yes Historical Provider, MD  ondansetron (ZOFRAN) 8 MG tablet Take 8 mg by mouth every 8 (eight) hours as needed for nausea or vomiting (nausea).   Yes Historical Provider, MD  oxyCODONE (ROXICODONE) 5 MG immediate release tablet Take 1 tablet (5 mg total) by mouth every 4 (four) hours as needed for severe pain. 02/20/14  Yes Shari A Upstill, PA-C  prochlorperazine (COMPAZINE) 10 MG tablet Take 10 mg by mouth every 6 (six) hours as needed for nausea or vomiting (nausea).   Yes  Historical Provider, MD   BP 106/64  Pulse 91  Temp(Src) 97.9 F (36.6 C) (Oral)  Resp 20  SpO2 100% Physical Exam  Nursing note and vitals reviewed. Constitutional: She is oriented to person, place, and time. She appears well-developed and well-nourished. No distress.  HENT:  Head: Normocephalic and atraumatic.  Right Ear: External ear normal.  Left Ear: External ear normal.  Nose: Nose normal.  Eyes: Right eye exhibits no discharge. Left eye exhibits no discharge.  Cardiovascular:  Normal rate, regular rhythm and normal heart sounds.   Pulmonary/Chest: Effort normal and breath sounds normal. She has no wheezes. She has no rales.  Abdominal: Soft. There is tenderness.  Wound vac in place to lower abdomen. Skin looks consistent with healing tissue. No drainage or cellulitis. Scar proximal to this also appears well. There is tenderness over the wound scar that she states is chronic and not worse currently.  Neurological: She is alert and oriented to person, place, and time.  Skin: Skin is warm and dry.    ED Course  Procedures (including critical care time) Labs Review Labs Reviewed  CBC WITH DIFFERENTIAL - Abnormal; Notable for the following:    RBC 3.36 (*)    Hemoglobin 8.2 (*)    HCT 26.7 (*)    MCH 24.4 (*)    RDW 18.5 (*)    Neutrophils Relative % 87 (*)    Lymphocytes Relative 11 (*)    Monocytes Relative 1 (*)    All other components within normal limits  BASIC METABOLIC PANEL - Abnormal; Notable for the following:    Sodium 134 (*)    Glucose, Bld 104 (*)    BUN 29 (*)    GFR calc non Af Amer 63 (*)    GFR calc Af Amer 73 (*)    All other components within normal limits  URINALYSIS, ROUTINE W REFLEX MICROSCOPIC  I-STAT CG4 LACTIC ACID, ED    Imaging Review Dg Chest 2 View  03/20/2014   CLINICAL DATA:  Near syncope  EXAM: CHEST - 2 VIEW  COMPARISON:  None.  FINDINGS: Right internal jugular Port-A-Cath tip at the cavoatrial junction. Low volumes. Clear lungs. Normal heart size. No pneumothorax.  IMPRESSION: No active cardiopulmonary disease.   Electronically Signed   By: Maryclare Bean M.D.   On: 03/20/2014 22:13     EKG Interpretation   Date/Time:  Wednesday March 20 2014 21:34:39 EDT Ventricular Rate:  94 PR Interval:  136 QRS Duration: 92 QT Interval:  341 QTC Calculation: 426 R Axis:   61 Text Interpretation:  Normal sinus rhythm Normal ECG Confirmed by Coltyn Hanning   MD, Jeweline Reif ([id number]) on 03/20/2014 9:44:45 PM      MDM   Final diagnoses:   Near syncope    Patient is well-appearing here, has normal blood pressure and otherwise normal vital signs. She has no current chest pain, shortness of breath, hypoxia, or tachycardia to suggest needing a PE workup, and she is currently compliant with her Lovenox for her left leg DVT. I think that her symptoms are likely a combination of getting nauseous from taking an oxycodone prior to getting her wound VAC changed combined with recent initiation of chemotherapy. She does feel better with IV fluids. Her work appears benign. I discussed this with the patient and family they feel comfortable going home and will call her doctor in the morning. She will hold her blood pressure medicine in the morning until she has talked her doctor.  Ephraim Hamburger, MD 03/20/14 [phone]

## 2014-03-20 NOTE — ED Notes (Signed)
Bed: WA17 Expected date: 03/20/14 Expected time: 7:56 PM Means of arrival: Ambulance Comments: hypotension

## 2014-03-20 NOTE — ED Notes (Signed)
Patient up to bathroom without difficulty Patient appears in NAD

## 2014-04-09 ENCOUNTER — Telehealth: Payer: Self-pay | Admitting: *Deleted

## 2014-04-09 NOTE — Telephone Encounter (Signed)
Called pt with appt for 04/17/14 3pm- spoke with pt's sister June who advised this date/time would be fine.

## 2014-04-16 NOTE — Progress Notes (Signed)
Pt called cancelled appt. Declined to r/s at this time

## 2014-04-17 ENCOUNTER — Ambulatory Visit: Payer: BC Managed Care – PPO | Admitting: Gynecology

## 2015-12-11 ENCOUNTER — Emergency Department (HOSPITAL_COMMUNITY): Payer: BLUE CROSS/BLUE SHIELD

## 2015-12-11 ENCOUNTER — Emergency Department (HOSPITAL_COMMUNITY)
Admission: EM | Admit: 2015-12-11 | Discharge: 2015-12-11 | Disposition: A | Discharge status: Home Health | Payer: BLUE CROSS/BLUE SHIELD | Attending: Emergency Medicine | Admitting: Emergency Medicine

## 2015-12-11 ENCOUNTER — Encounter (HOSPITAL_COMMUNITY): Payer: Self-pay | Admitting: Emergency Medicine

## 2015-12-11 DIAGNOSIS — R0600 Dyspnea, unspecified: Secondary | ICD-10-CM | POA: Diagnosis not present

## 2015-12-11 DIAGNOSIS — Z86718 Personal history of other venous thrombosis and embolism: Secondary | ICD-10-CM | POA: Insufficient documentation

## 2015-12-11 DIAGNOSIS — R0602 Shortness of breath: Secondary | ICD-10-CM | POA: Diagnosis present

## 2015-12-11 DIAGNOSIS — F419 Anxiety disorder, unspecified: Secondary | ICD-10-CM | POA: Insufficient documentation

## 2015-12-11 DIAGNOSIS — Z79899 Other long term (current) drug therapy: Secondary | ICD-10-CM | POA: Insufficient documentation

## 2015-12-11 DIAGNOSIS — R079 Chest pain, unspecified: Secondary | ICD-10-CM | POA: Insufficient documentation

## 2015-12-11 DIAGNOSIS — R000 Tachycardia, unspecified: Secondary | ICD-10-CM | POA: Diagnosis not present

## 2015-12-11 DIAGNOSIS — M25511 Pain in right shoulder: Secondary | ICD-10-CM | POA: Diagnosis not present

## 2015-12-11 DIAGNOSIS — R600 Localized edema: Secondary | ICD-10-CM | POA: Diagnosis not present

## 2015-12-11 DIAGNOSIS — E039 Hypothyroidism, unspecified: Secondary | ICD-10-CM | POA: Insufficient documentation

## 2015-12-11 DIAGNOSIS — I10 Essential (primary) hypertension: Secondary | ICD-10-CM | POA: Insufficient documentation

## 2015-12-11 DIAGNOSIS — Z8543 Personal history of malignant neoplasm of ovary: Secondary | ICD-10-CM | POA: Diagnosis not present

## 2015-12-11 LAB — I-STAT TROPONIN, ED: Troponin i, poc: 0 ng/mL (ref 0.00–0.08)

## 2015-12-11 LAB — BASIC METABOLIC PANEL
Anion gap: 12 (ref 5–15)
BUN: 25 mg/dL — ABNORMAL HIGH (ref 6–20)
CHLORIDE: 102 mmol/L (ref 101–111)
CO2: 26 mmol/L (ref 22–32)
Calcium: 9.7 mg/dL (ref 8.9–10.3)
Creatinine, Ser: 0.94 mg/dL (ref 0.44–1.00)
GFR calc non Af Amer: >60 mL/min (ref >60)
Glucose, Bld: 116 mg/dL — ABNORMAL HIGH (ref 65–99)
POTASSIUM: 4.3 mmol/L (ref 3.5–5.1)
SODIUM: 140 mmol/L (ref 135–145)

## 2015-12-11 LAB — CBC
HEMATOCRIT: 33.5 % — AB (ref 36.0–46.0)
HEMOGLOBIN: 10 g/dL — AB (ref 12.0–15.0)
MCH: 29.6 pg (ref 26.0–34.0)
MCHC: 29.9 g/dL — ABNORMAL LOW (ref 30.0–36.0)
MCV: 99.1 fL (ref 78.0–100.0)
Platelets: 167 10*3/uL (ref 150–400)
RBC: 3.38 MIL/uL — AB (ref 3.87–5.11)
RDW: 19.9 % — ABNORMAL HIGH (ref 11.5–15.5)
WBC: 8.8 10*3/uL (ref 4.0–10.5)

## 2015-12-11 LAB — CBC WITH DIFFERENTIAL/PLATELET
BASOS ABS: 0 10*3/uL (ref 0.0–0.1)
BASOS PCT: 0 %
EOS PCT: 0 %
Eosinophils Absolute: 0 10*3/uL (ref 0.0–0.7)
HCT: 32.7 % — ABNORMAL LOW (ref 36.0–46.0)
Hemoglobin: 10 g/dL — ABNORMAL LOW (ref 12.0–15.0)
Lymphocytes Relative: 14 %
Lymphs Abs: 1.5 10*3/uL (ref 0.7–4.0)
MCH: 30.2 pg (ref 26.0–34.0)
MCHC: 30.6 g/dL (ref 30.0–36.0)
MCV: 98.8 fL (ref 78.0–100.0)
MONO ABS: 1.9 10*3/uL — AB (ref 0.1–1.0)
Monocytes Relative: 18 %
Neutro Abs: 7.2 10*3/uL (ref 1.7–7.7)
Neutrophils Relative %: 68 %
PLATELETS: 170 10*3/uL (ref 150–400)
RBC: 3.31 MIL/uL — ABNORMAL LOW (ref 3.87–5.11)
RDW: 20 % — AB (ref 11.5–15.5)
WBC: 10.7 10*3/uL — ABNORMAL HIGH (ref 4.0–10.5)

## 2015-12-11 LAB — BRAIN NATRIURETIC PEPTIDE: B NATRIURETIC PEPTIDE 5: 25.4 pg/mL (ref 0.0–100.0)

## 2015-12-11 LAB — PROTIME-INR
INR: 1.11 (ref 0.00–1.49)
Prothrombin Time: 14.5 seconds (ref 11.6–15.2)

## 2015-12-11 LAB — I-STAT CG4 LACTIC ACID, ED
LACTIC ACID, VENOUS: 0.9 mmol/L (ref 0.5–2.0)
LACTIC ACID, VENOUS: 0.87 mmol/L (ref 0.5–2.0)

## 2015-12-11 MED ORDER — ENOXAPARIN SODIUM 150 MG/ML ~~LOC~~ SOLN
1.5000 mg/kg | Freq: Once | SUBCUTANEOUS | Status: AC
  Administered 2015-12-11: 190 mg via SUBCUTANEOUS
  Filled 2015-12-11: qty 1.27

## 2015-12-11 MED ORDER — TECHNETIUM TO 99M ALBUMIN AGGREGATED
34.1000 | Freq: Once | INTRAVENOUS | Status: AC | PRN
  Administered 2015-12-11: 34.1 via INTRAVENOUS

## 2015-12-11 MED ORDER — LEVOFLOXACIN 750 MG PO TABS: 750.0000 mg | ORAL_TABLET | Freq: Every day | ORAL | Status: AC

## 2015-12-11 MED ORDER — TECHNETIUM TC 99M DIETHYLENETRIAME-PENTAACETIC ACID
32.0000 | Freq: Once | INTRAVENOUS | Status: DC | PRN
Start: 2015-12-11 — End: 2015-12-12

## 2015-12-11 MED ORDER — LEVOFLOXACIN 750 MG PO TABS
750.0000 mg | ORAL_TABLET | Freq: Once | ORAL | Status: AC
  Administered 2015-12-11: 750 mg via ORAL
  Filled 2015-12-11: qty 1

## 2015-12-11 MED ORDER — HEPARIN SOD (PORK) LOCK FLUSH 100 UNIT/ML IV SOLN
500.0000 [IU] | Freq: Once | INTRAVENOUS | Status: AC
  Administered 2015-12-11: 500 [IU]
  Filled 2015-12-11: qty 5

## 2015-12-11 NOTE — Discharge Instructions (Signed)
You were seen today for your shortness of breath.  The exact cause is not completely clear.  Return immediately with worsening symptoms.  Take the antibiotics as prescribed.  Follow up tomorrow for the imaging of your arm and legs to rule out a blood clot.  Make arrangements for follow up with your oncologist and primary care physician as soon as possible.  Shortness of Breath Shortness of breath means you have trouble breathing. It could also mean that you have a medical problem. You should get immediate medical care for shortness of breath. CAUSES   Not enough oxygen in the air such as with high altitudes or a smoke-filled room.  Certain lung diseases, infections, or problems.  Heart disease or conditions, such as angina or heart failure.  Low red blood cells (anemia).  Poor physical fitness, which can cause shortness of breath when you exercise.  Chest or back injuries or stiffness.  Being overweight.  Smoking.  Anxiety, which can make you feel like you are not getting enough air. DIAGNOSIS  Serious medical problems can often be found during your physical exam. Tests may also be done to determine why you are having shortness of breath. Tests may include:  Chest X-rays.  Lung function tests.  Blood tests.  An electrocardiogram (ECG).  An ambulatory electrocardiogram. An ambulatory ECG records your heartbeat patterns over a 24-hour period.  Exercise testing.  A transthoracic echocardiogram (TTE). During echocardiography, sound waves are used to evaluate how blood flows through your heart.  A transesophageal echocardiogram (TEE).  Imaging scans. Your health care provider may not be able to find a cause for your shortness of breath after your exam. In this case, it is important to have a follow-up exam with your health care provider as directed.  TREATMENT  Treatment for shortness of breath depends on the cause of your symptoms and can vary greatly. HOME CARE INSTRUCTIONS    Do not smoke. Smoking is a common cause of shortness of breath. If you smoke, ask for help to quit.  Avoid being around chemicals or things that may bother your breathing, such as paint fumes and dust.  Rest as needed. Slowly resume your usual activities.  If medicines were prescribed, take them as directed for the full length of time directed. This includes oxygen and any inhaled medicines.  Keep all follow-up appointments as directed by your health care provider. SEEK MEDICAL CARE IF:   Your condition does not improve in the time expected.  You have a hard time doing your normal activities even with rest.  You have any new symptoms. SEEK IMMEDIATE MEDICAL CARE IF:   Your shortness of breath gets worse.  You feel light-headed, faint, or develop a cough not controlled with medicines.  You start coughing up blood.  You have pain with breathing.  You have chest pain or pain in your arms, shoulders, or abdomen.  You have a fever.  You are unable to walk up stairs or exercise the way you normally do. MAKE SURE YOU:  Understand these instructions.  Will watch your condition.  Will get help right away if you are not doing well or get worse.   This information is not intended to replace advice given to you by your health care provider. Make sure you discuss any questions you have with your health care provider.   Document Released: 08/10/2001 Document Revised: 11/20/2013 Document Reviewed: 01/31/2012 Elsevier Interactive Patient Education [employer].

## 2015-12-11 NOTE — ED Notes (Signed)
MD at bedside. 

## 2015-12-11 NOTE — ED Notes (Signed)
Pt in radiology, cannot assess vitals

## 2015-12-11 NOTE — ED Notes (Signed)
Vas called but was at [place] she will come or the on call person will .

## 2015-12-11 NOTE — ED Notes (Signed)
Pt in [place] 

## 2015-12-11 NOTE — ED Notes (Signed)
Nurse to pull BNP from pt's port

## 2015-12-11 NOTE — ED Provider Notes (Signed)
CSN: [id number]     Arrival date & time 12/11/15  1003 History   First MD Initiated Contact with Patient 12/11/15 1506     Chief Complaint  Patient presents with  . Shortness of Breath  . Cancer      (Consider location/radiation/quality/duration/timing/severity/associated sxs/prior Treatment) HPI Comments: 50 y.o. Female with history of HTN, hypothyroidism, DVT, Ovarian CA in active treatment with last treatment 8 days ago presents for shortness of breath as well as right shoulder and chest pain.  The patient reports the symptoms started last night and that they are worse with walking and exertion.  Chest pain is also worse with taking a deep breath.  No fevers, chills, cough, nausea, or vomiting.  Denies sick contacts.  She reports some mild lower extremity swelling.   Past Medical History  Diagnosis Date  . Hypertension   . Hypothyroid   . Anxiety   . Ovarian cancer [facility])    Past Surgical History  Procedure Laterality Date  . Abdominal hysterectomy     History reviewed. No pertinent family history. Social History  Substance Use Topics  . Smoking status: Never Smoker   . Smokeless tobacco: None  . Alcohol Use: No   OB History    No data available     Review of Systems  Constitutional: Negative for fever, chills, appetite change and fatigue.  HENT: Negative for congestion, postnasal drip and rhinorrhea.   Eyes: Negative for pain and redness.  Respiratory: Positive for shortness of breath. Negative for cough, chest tightness and wheezing.   Cardiovascular: Positive for chest pain (right sided) and leg swelling. Negative for palpitations.  Gastrointestinal: Negative for nausea, vomiting, diarrhea, constipation and abdominal distention.  Genitourinary: Negative for dysuria, urgency and frequency.  Musculoskeletal: Positive for arthralgias (right shoulder). Negative for myalgias and back pain.  Skin: Negative for rash.  Neurological: Negative for dizziness, weakness,  light-headedness and headaches.  Hematological: Does not bruise/bleed easily.      Allergies  Advil; Other; Shellfish allergy; Amlodipine; Beta adrenergic blockers; and Lisinopril  Home Medications   Prior to Admission medications   Medication Sig Start Date End Date Taking? Authorizing Provider  CALCIUM-MAGNESIUM-VITAMIN D PO Take 1 tablet by mouth daily.   Yes Historical Provider, MD  Cholecalciferol (VITAMIN D) 2000 units tablet Take 2,000 Units by mouth daily.   Yes Historical Provider, MD  cloNIDine (CATAPRES - DOSED IN MG/24 HR) 0.3 mg/24hr patch Place 0.3 mg onto the skin every Tuesday.   Yes Historical Provider, MD  levothyroxine (SYNTHROID, LEVOTHROID) 88 MCG tablet Take 88 mcg by mouth daily before breakfast.   Yes Historical Provider, MD  lidocaine-prilocaine (EMLA) cream Apply 1 application topically as needed (uses for port).   Yes Historical Provider, MD  NIFEdipine (PROCARDIA-XL/ADALAT CC) 30 MG 24 hr tablet Take 30 mg by mouth daily.   Yes Historical Provider, MD  OLANZapine (ZYPREXA) 5 MG tablet Take 5 mg by mouth at bedtime as needed (for nausa).   Yes Historical Provider, MD  ondansetron (ZOFRAN) 4 MG tablet Take 4 mg by mouth every 8 (eight) hours as needed for nausea or vomiting.   Yes Historical Provider, MD  oxyCODONE (ROXICODONE) 5 MG immediate release tablet Take 1 tablet (5 mg total) by mouth every 4 (four) hours as needed for severe pain. 02/20/14  Yes Charlann Lange, PA-C  oxyCODONE-acetaminophen (PERCOCET) 10-325 MG tablet Take 0.5-1 tablets by mouth every 4 (four) hours as needed for pain.   Yes Historical Provider, MD  PRESCRIPTION MEDICATION  Chemo - Gemizar and [place]   Yes Historical Provider, MD  promethazine (PHENERGAN) 25 MG tablet Take 25 mg by mouth every 6 (six) hours as needed for nausea or vomiting.   Yes Historical Provider, MD  traMADol (ULTRAM) 50 MG tablet Take 50 mg by mouth every 6 (six) hours as needed for moderate pain.   Yes  Historical Provider, MD  TURMERIC PO Take 750 mg by mouth daily.   Yes Historical Provider, MD  valsartan (DIOVAN) 320 MG tablet Take 320 mg by mouth daily.   Yes Historical Provider, MD  vitamin C (ASCORBIC ACID) 500 MG tablet Take 500 mg by mouth daily.   Yes Historical Provider, MD  enoxaparin (LOVENOX) 150 MG/ML injection Inject 1 mL (150 mg total) into the skin daily. Patient not taking: Reported on 12/11/2015 02/20/14   Charlann Lange, PA-C  levofloxacin (LEVAQUIN) 750 MG tablet Take 1 tablet (750 mg total) by mouth daily. 12/11/15   Harvel Quale, MD   BP 135/59 mmHg  Pulse 125  Temp(Src) 99.5 F (37.5 C) (Oral)  Resp 28  Ht 5\' 1"  (1.549 m)  Wt 282 lb (127.914 kg)  BMI 53.31 kg/m2  SpO2 98% Physical Exam  Constitutional: She is oriented to person, place, and time. She appears well-developed and well-nourished. No distress.  HENT:  Head: Normocephalic and atraumatic.  Right Ear: External ear normal.  Left Ear: External ear normal.  Nose: Nose normal.  Mouth/Throat: Oropharynx is clear and moist. No oropharyngeal exudate.  Eyes: EOM are normal. Pupils are equal, round, and reactive to light.  Neck: Normal range of motion. Neck supple.  Cardiovascular: Regular rhythm, normal heart sounds and intact distal pulses.  Tachycardia present.   No murmur heard. Pulmonary/Chest: Effort normal. No respiratory distress. She has no wheezes. She has no rales.  Abdominal: Soft. She exhibits no distension. There is no tenderness.  Musculoskeletal: Normal range of motion. She exhibits edema (trace, bilateral lower extremity). She exhibits no tenderness.  Neurological: She is alert and oriented to person, place, and time.  Skin: Skin is warm and dry. No rash noted. She is not diaphoretic.  Vitals reviewed.   ED Course  Procedures (including critical care time) Labs Review Labs Reviewed  BASIC METABOLIC PANEL - Abnormal; Notable for the following:    Glucose, Bld 116 (*)    BUN 25 (*)     All other components within normal limits  CBC - Abnormal; Notable for the following:    RBC 3.38 (*)    Hemoglobin 10.0 (*)    HCT 33.5 (*)    MCHC 29.9 (*)    RDW 19.9 (*)    All other components within normal limits  CBC WITH DIFFERENTIAL/PLATELET - Abnormal; Notable for the following:    WBC 10.7 (*)    RBC 3.31 (*)    Hemoglobin 10.0 (*)    HCT 32.7 (*)    RDW 20.0 (*)    Monocytes Absolute 1.9 (*)    All other components within normal limits  PROTIME-INR  BRAIN NATRIURETIC PEPTIDE  I-STAT TROPOININ, ED  I-STAT CG4 LACTIC ACID, ED  I-STAT CG4 LACTIC ACID, ED    Imaging Review Dg Chest 2 View  12/11/2015  CLINICAL DATA:  Metastatic ovarian cancer. Shortness breath. Chest pain. EXAM: CHEST - 2 VIEW COMPARISON:  Two-view chest x-ray 03/20/2014. FINDINGS: The heart is mildly enlarged. Mild pulmonary vascular congestion is present. A small right pleural effusion is new. Asymmetric right basilar airspace opacities  are noted. No focal airspace consolidation is evident. A double-lumen right IJ Port-A-Cath is stable. The visualized soft tissues and bony thorax are unremarkable. IMPRESSION: 1. Small right pleural effusion and asymmetric right lower lobe airspace disease. The differential diagnosis includes atelectasis or infection. A malignant effusion is also considered. 2. Cardiomegaly and mild pulmonary vascular congestion. Electronically Signed   By: San Morelle M.D.   On: 12/11/2015 11:06   Nm Pulmonary Perf And Vent  12/11/2015  CLINICAL DATA:  Shortness of Breath EXAM: NUCLEAR MEDICINE VENTILATION - PERFUSION LUNG SCAN TECHNIQUE: Ventilation images were obtained in multiple projections using inhaled aerosol Tc-38m DTPA. Perfusion images were obtained in multiple projections after intravenous injection of Tc-95m MAA. RADIOPHARMACEUTICALS:  [id number] millicuries [id number] DTPA aerosol inhalation and 4.1 millicuries [id number] MAA IV COMPARISON:  Chest x-ray 12/11/2015  FINDINGS: Ventilation: No segmental ventilation defects are noted. There is some clumping of the tracer in central airways. Perfusion: No wedge shaped peripheral perfusion defects to suggest acute pulmonary embolism. Chest x-ray shows no segmental infiltrates. Findings are low probability for pulmonary embolus. IMPRESSION: Low probability for pulmonary embolus. Electronically Signed   By: Lahoma Crocker M.D.   On: 12/11/2015 19:25   I have personally reviewed and evaluated these images and lab results as part of my medical decision-making.   EKG Interpretation   Date/Time:  Thursday December 11 2015 10:18:59 EST Ventricular Rate:  108 PR Interval:  149 QRS Duration: 102 QT Interval:  321 QTC Calculation: 430 R Axis:   81 Text Interpretation:  Sinus tachycardia Minimal ST depression, inferior  leads Confirmed by Wilson Singer  MD, STEPHEN ([id number]) on 12/11/2015 10:43:49 AM      MDM  Patient seen and evaluated in stable condition.  Patient tachycardic with normal lung sounds.  Trace edema on examination.  Patient reports history of allergies that preclude her from receiving IV contrast. Chest xray with cardiomegaly and small right sided pleural effusion.    VQ scan obtained that showed low probability for PE.  Vascular tech unable to do dopplers of upper extremity and lower extremities at this time secondary to patient having been at VQ scan.  Bedside US without pericardial effusion.  BNP normal.  Patient reported feeling well enough for discharge and was requesting discharge home.  She was covered with dose of lovenox for concern for possible DVT of either lower extremities or of right upper extremity.  Orders were placed for patient to have studies completed in the AM.  Patient was started on Levaquin for possible infection in light of the effusion.  Patient was discharged home in stable condition with strict return precautions and instructed to follow up with her PCP/oncology outpatient.  Patient stated she  would return immediately with worsening symptoms. Final diagnoses:  Shortness of breath  Dyspnea    1. Dypsnea  2. Tachycardia    Harvel Quale, MD 12/12/15 [phone]

## 2015-12-11 NOTE — ED Notes (Signed)
Pt with hx of metastatic ovarian cancer and multiple thrombi c/o SOB, right shoulder and right side pain worsened with cough onset last night at 1900. Last chemo dose was last week.

## 2015-12-11 NOTE — ED Notes (Signed)
PT remains in [place] for scan

## 2015-12-12 ENCOUNTER — Ambulatory Visit (HOSPITAL_COMMUNITY)
Admission: RE | Admit: 2015-12-12 | Discharge: 2015-12-12 | Disposition: A | Discharge status: Home / Self Care | Payer: BLUE CROSS/BLUE SHIELD | Source: Ambulatory Visit | Attending: Emergency Medicine | Admitting: Emergency Medicine

## 2015-12-12 ENCOUNTER — Ambulatory Visit (HOSPITAL_BASED_OUTPATIENT_CLINIC_OR_DEPARTMENT_OTHER)
Admission: RE | Admit: 2015-12-12 | Discharge: 2015-12-12 | Disposition: A | Discharge status: Home / Self Care | Payer: BLUE CROSS/BLUE SHIELD | Source: Ambulatory Visit | Attending: Emergency Medicine | Admitting: Emergency Medicine

## 2015-12-12 DIAGNOSIS — M79609 Pain in unspecified limb: Secondary | ICD-10-CM | POA: Diagnosis not present

## 2015-12-12 DIAGNOSIS — M79621 Pain in right upper arm: Secondary | ICD-10-CM | POA: Diagnosis not present

## 2015-12-12 DIAGNOSIS — R0602 Shortness of breath: Secondary | ICD-10-CM | POA: Diagnosis not present

## 2015-12-12 DIAGNOSIS — M25511 Pain in right shoulder: Secondary | ICD-10-CM | POA: Diagnosis not present

## 2015-12-12 DIAGNOSIS — Z86718 Personal history of other venous thrombosis and embolism: Secondary | ICD-10-CM | POA: Diagnosis not present

## 2015-12-12 NOTE — Progress Notes (Signed)
Preliminary result by tech - Venous Duplex Lower Ext. Completed. Right leg, no evidence of deep or superficial vein thrombosis. 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 All other veins are patent without evidence of thrombus.  Oda Cogan, BS, RDMS, RVT

## 2015-12-12 NOTE — Progress Notes (Signed)
Preliminary results by tech - Right Upper Ext. Venous Completed. No evidence of deep and superficial vein thrombosis in the right arm. Oda Cogan, BS, RDMS, RVT

## 2016-03-31 ENCOUNTER — Encounter: Payer: Self-pay | Admitting: Internal Medicine

## 2016-07-28 IMAGING — CR DG CHEST 2V
2 series · 2 of 2 positions shown · non-contrast
Comparison: Two-view chest x-ray 03/20/2014.

CLINICAL DATA: Metastatic ovarian cancer. Shortness breath. Chest
pain.

EXAM:
CHEST - 2 VIEW

[w chest pa]
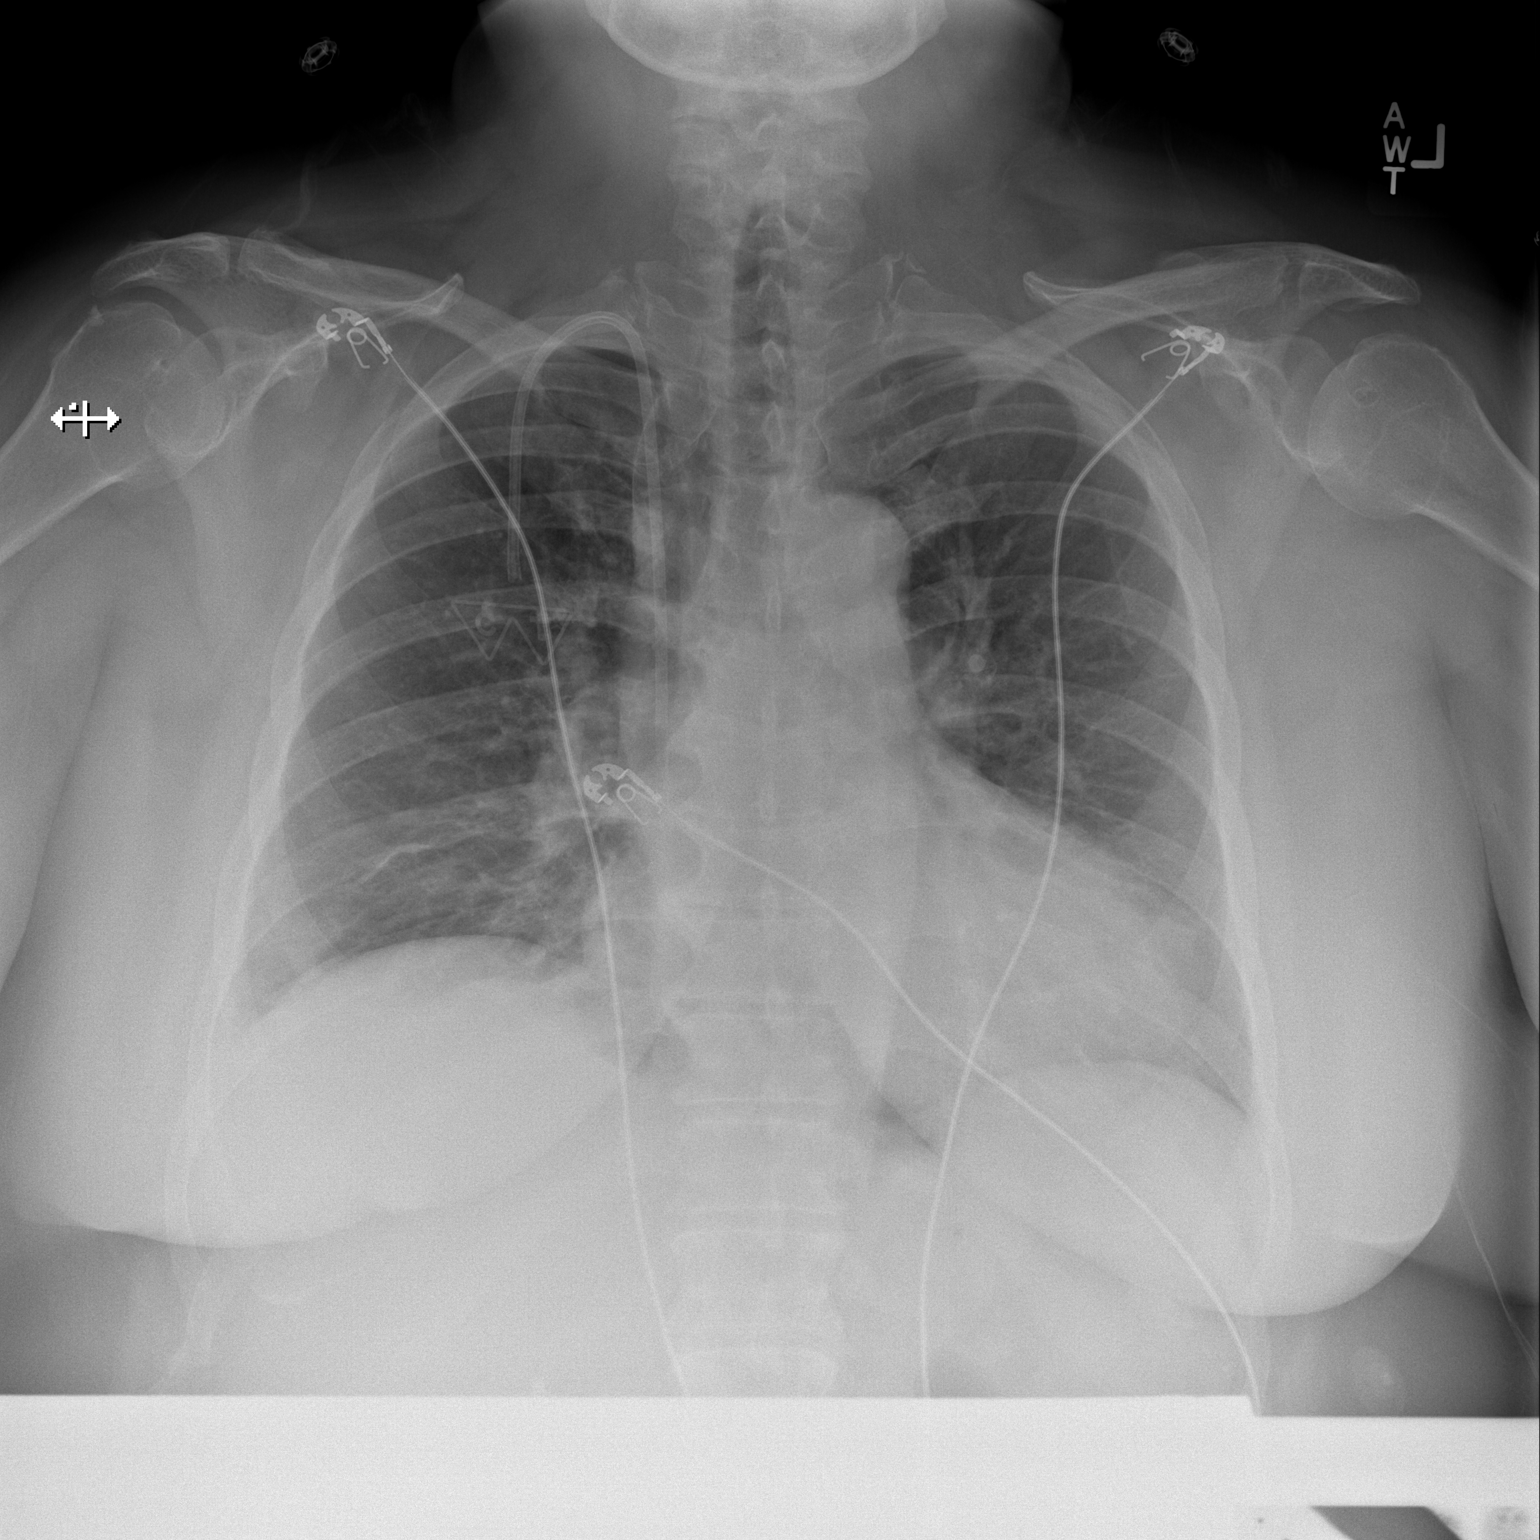

[w chest lat]
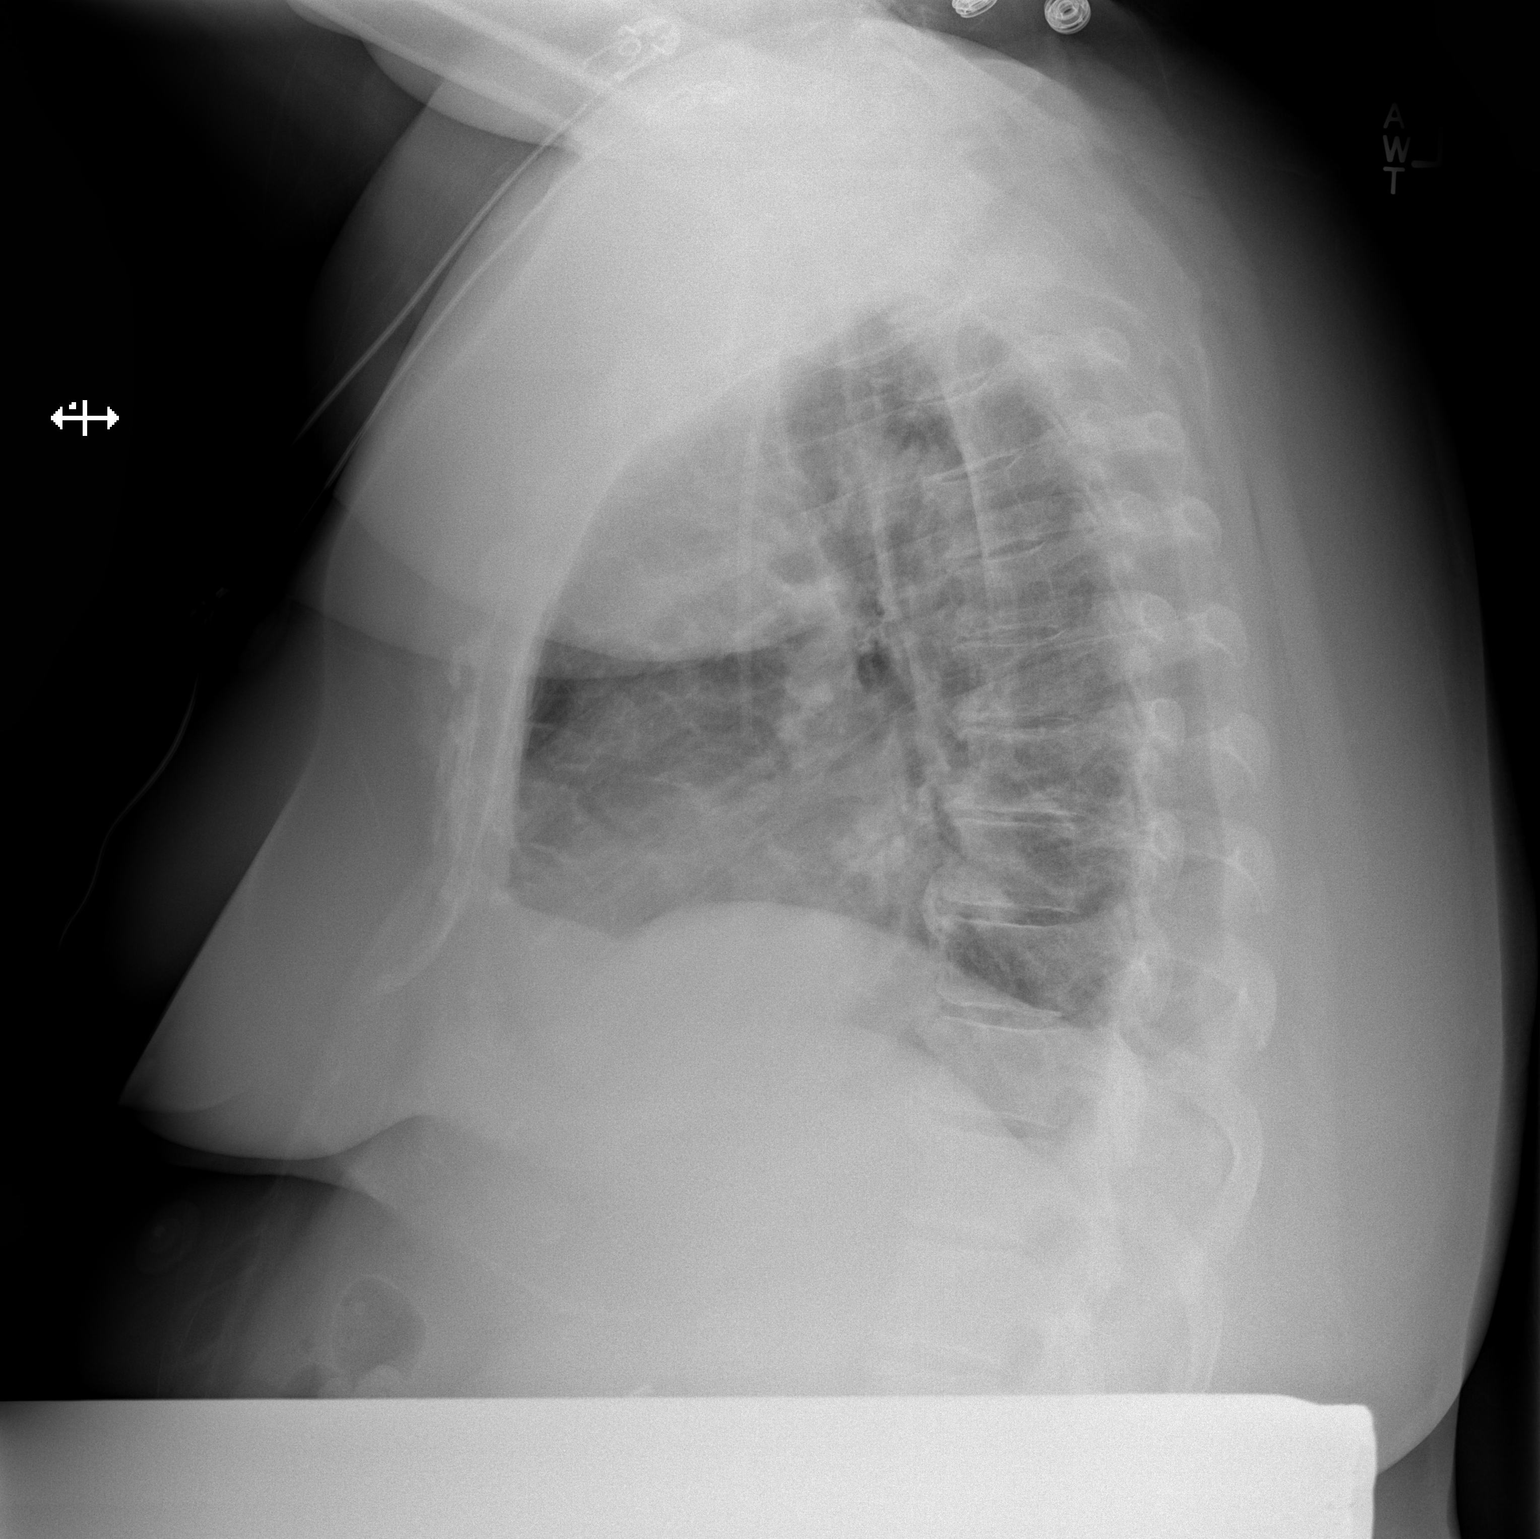

[2 of 2 positions shown; findings below may reference images not displayed]

FINDINGS: The heart is mildly enlarged. Mild pulmonary vascular congestion is
present. A small right pleural effusion is new. Asymmetric right
basilar airspace opacities are noted. No focal airspace
consolidation is evident.

A double-lumen right IJ Port-A-Cath is stable. The visualized soft
tissues and bony thorax are unremarkable.
IMPRESSION: 1. Small right pleural effusion and asymmetric right lower lobe
airspace disease. The differential diagnosis includes atelectasis or
infection. A malignant effusion is also considered.
2. Cardiomegaly and mild pulmonary vascular congestion.

## 2017-03-29 DEATH — deceased
